# Patient Record
Sex: Female | Born: 1985 | Race: White | Hispanic: No | Marital: Married | Smoking: Never smoker
Health system: Southern US, Community
[De-identification: ages and names within clinical notes are randomized; demographics above are authoritative.]

## PROBLEM LIST (undated history)

## (undated) ENCOUNTER — Emergency Department (HOSPITAL_COMMUNITY): Discharge status: Absent without leave | Payer: 59

## (undated) DIAGNOSIS — N809 Endometriosis, unspecified: Secondary | ICD-10-CM

## (undated) DIAGNOSIS — D649 Anemia, unspecified: Secondary | ICD-10-CM

## (undated) DIAGNOSIS — E079 Disorder of thyroid, unspecified: Secondary | ICD-10-CM

## (undated) DIAGNOSIS — R519 Headache, unspecified: Secondary | ICD-10-CM

## (undated) DIAGNOSIS — E039 Hypothyroidism, unspecified: Secondary | ICD-10-CM

## (undated) DIAGNOSIS — N63 Unspecified lump in unspecified breast: Secondary | ICD-10-CM

## (undated) DIAGNOSIS — Z8489 Family history of other specified conditions: Secondary | ICD-10-CM

## (undated) DIAGNOSIS — E559 Vitamin D deficiency, unspecified: Secondary | ICD-10-CM

## (undated) DIAGNOSIS — R51 Headache: Secondary | ICD-10-CM

## (undated) HISTORY — DX: Endometriosis, unspecified: N80.9

## (undated) HISTORY — DX: Unspecified lump in unspecified breast: N63.0

## (undated) HISTORY — PX: TUBAL LIGATION: SHX77

## (undated) HISTORY — DX: Anemia, unspecified: D64.9

## (undated) HISTORY — DX: Vitamin D deficiency, unspecified: E55.9

## (undated) HISTORY — DX: Disorder of thyroid, unspecified: E07.9

---

## 2007-12-21 ENCOUNTER — Emergency Department: Payer: Self-pay | Admitting: Emergency Medicine

## 2008-04-10 ENCOUNTER — Observation Stay: Payer: Self-pay | Admitting: Obstetrics and Gynecology

## 2008-10-30 HISTORY — PX: FETAL BLOOD TRANSFUSION: SHX1602

## 2010-04-25 ENCOUNTER — Other Ambulatory Visit: Admission: RE | Admit: 2010-04-25 | Discharge: 2010-04-25 | Payer: Self-pay | Admitting: Family Medicine

## 2010-04-25 ENCOUNTER — Ambulatory Visit: Payer: Self-pay | Admitting: Family Medicine

## 2010-04-25 DIAGNOSIS — R030 Elevated blood-pressure reading, without diagnosis of hypertension: Secondary | ICD-10-CM | POA: Insufficient documentation

## 2010-04-25 DIAGNOSIS — Z862 Personal history of diseases of the blood and blood-forming organs and certain disorders involving the immune mechanism: Secondary | ICD-10-CM | POA: Insufficient documentation

## 2010-04-25 DIAGNOSIS — R5383 Other fatigue: Secondary | ICD-10-CM

## 2010-04-25 DIAGNOSIS — E038 Other specified hypothyroidism: Secondary | ICD-10-CM | POA: Insufficient documentation

## 2010-04-25 DIAGNOSIS — D509 Iron deficiency anemia, unspecified: Secondary | ICD-10-CM | POA: Insufficient documentation

## 2010-04-25 DIAGNOSIS — R5381 Other malaise: Secondary | ICD-10-CM | POA: Insufficient documentation

## 2010-04-25 LAB — CONVERTED CEMR LAB

## 2010-04-27 ENCOUNTER — Encounter: Payer: Self-pay | Admitting: Family Medicine

## 2010-04-27 LAB — CONVERTED CEMR LAB
Basophils Absolute: 0 10*3/uL (ref 0.0–0.1)
CO2: 28 meq/L (ref 19–32)
Chloride: 105 meq/L (ref 96–112)
Direct LDL: 62.1 mg/dL
Eosinophils Relative: 0.4 % (ref 0.0–5.0)
Glucose, Bld: 99 mg/dL (ref 70–99)
HCT: 36.6 % (ref 36.0–46.0)
HDL: 45.9 mg/dL (ref >39.00)
Hemoglobin: 13 g/dL (ref 12.0–15.0)
Lymphocytes Relative: 37.4 % (ref 12.0–46.0)
Lymphs Abs: 2.5 10*3/uL (ref 0.7–4.0)
Monocytes Relative: 6.1 % (ref 3.0–12.0)
Platelets: 248 10*3/uL (ref 150.0–400.0)
RDW: 12.5 % (ref 11.5–14.6)
Sodium: 138 meq/L (ref 135–145)
TSH: 4.03 microintl units/mL (ref 0.35–5.50)
Total CHOL/HDL Ratio: 3
Triglycerides: 291 mg/dL — ABNORMAL HIGH (ref 0.0–149.0)
VLDL: 58.2 mg/dL — ABNORMAL HIGH (ref 0.0–40.0)
WBC: 6.6 10*3/uL (ref 4.5–10.5)

## 2010-07-28 ENCOUNTER — Ambulatory Visit: Payer: Self-pay | Admitting: Family Medicine

## 2010-07-28 DIAGNOSIS — E785 Hyperlipidemia, unspecified: Secondary | ICD-10-CM | POA: Insufficient documentation

## 2010-07-29 LAB — CONVERTED CEMR LAB
Direct LDL: 68.4 mg/dL
Total CHOL/HDL Ratio: 4

## 2010-08-01 ENCOUNTER — Telehealth: Payer: Self-pay | Admitting: Family Medicine

## 2010-09-08 ENCOUNTER — Ambulatory Visit: Payer: Self-pay | Admitting: Family Medicine

## 2010-09-08 DIAGNOSIS — E781 Pure hyperglyceridemia: Secondary | ICD-10-CM | POA: Insufficient documentation

## 2010-09-08 LAB — CONVERTED CEMR LAB
ALT: 20 units/L (ref 0–35)
AST: 23 units/L (ref 0–37)
Albumin: 3.6 g/dL (ref 3.5–5.2)
Cholesterol: 133 mg/dL (ref 0–200)
HDL: 48.7 mg/dL (ref >39.00)
Total Bilirubin: 0.6 mg/dL (ref 0.3–1.2)
Total Protein: 6.4 g/dL (ref 6.0–8.3)
Triglycerides: 76 mg/dL (ref 0.0–149.0)
VLDL: 15.2 mg/dL (ref 0.0–40.0)

## 2010-10-04 ENCOUNTER — Telehealth: Payer: Self-pay | Admitting: Family Medicine

## 2010-10-06 ENCOUNTER — Emergency Department: Payer: Self-pay | Admitting: Internal Medicine

## 2010-10-06 ENCOUNTER — Encounter: Payer: Self-pay | Admitting: Family Medicine

## 2010-10-10 ENCOUNTER — Ambulatory Visit: Payer: Self-pay | Admitting: Family Medicine

## 2010-10-10 DIAGNOSIS — K8020 Calculus of gallbladder without cholecystitis without obstruction: Secondary | ICD-10-CM | POA: Insufficient documentation

## 2010-10-11 ENCOUNTER — Telehealth: Payer: Self-pay | Admitting: Family Medicine

## 2010-10-14 ENCOUNTER — Encounter: Payer: Self-pay | Admitting: Family Medicine

## 2010-10-30 HISTORY — PX: CHOLECYSTECTOMY: SHX55

## 2010-11-02 ENCOUNTER — Ambulatory Visit: Payer: Self-pay | Admitting: General Surgery

## 2010-11-02 ENCOUNTER — Encounter: Payer: Self-pay | Admitting: Family Medicine

## 2010-11-19 ENCOUNTER — Encounter: Payer: Self-pay | Admitting: Family Medicine

## 2010-11-29 NOTE — Letter (Signed)
Summary: Results Follow-up Letter  [facility]  [address] [place], [place] [address]   Phone: [phone]  Fax: [phone]    04/27/2010  [address] [facility] [address] [place], [place]  [address]  Dear [name],   The following are the results of your recent test(s):  Test     Result     Pap Smear    Normal___X____  Not Normal_____       Comments: _________________________________________________________ Cholesterol LDL(Bad cholesterol):          Your goal is less than:         HDL (Good cholesterol):        Your goal is more than: _________________________________________________________ Other Tests:     Sincerely,        Ruthe Mannan, MD

## 2010-11-29 NOTE — Assessment & Plan Note (Signed)
Summary: 6-8 WEEK F/U SINCE STARTING TRICOR/NT   Vital Signs:  Patient profile:   25 year old female Height:      61.25 inches Weight:      208 pounds BMI:     39.12 Temp:     98.3 degrees F oral Pulse rate:   88 / minute Pulse rhythm:   regular BP sitting:   120 / 82  (left arm) Cuff size:   large  Vitals Entered By: Linde Gillis CMA [facility]) (September 08, 2010 7:58 AM) CC: 6-8 week follow up since starting Tricor   History of Present Illness: 25 yo here for follow up after starting Tricor.  Lipid panel on 9/29- TG 495, HDL 38, LDL 68. Mom has elevated TG as well. Started Tricor 145 mg on 10/5.  Not noticing any side effects. Trying to cut sugar out of diet.  Fasting glucose was 99 in September.    Current Medications (verified): 1)  Fish Oil 1000 Mg Caps (Omega-3 Fatty Acids) .... Take One By Mouth Every Night 2)  Prenatal Multi Vitamins .... Take One By Mouth Daily 3)  Levothroid 50 Mcg Tabs (Levothyroxine Sodium) .... Take One By Mouth Daily 4)  Gianvi 3-0.02 Mg Tabs (Drospirenone-Ethinyl Estradiol) .Marland Kitchen.. 1 Tab By Mouth Daily. 5)  Tricor 145 Mg Tabs (Fenofibrate) .Marland Kitchen.. 1 Tab By Mouth Daily.  Allergies (verified): No Known Drug Allergies  Review of Systems      See HPI General:  Denies malaise and weakness. MS:  Denies muscle aches.  Physical Exam  General:  alert, well-developed, and overweight-appearing.   Psych:  Cognition and judgment appear intact. Alert and cooperative with normal attention span and concentration. No apparent delusions, illusions, hallucinations   Impression & Recommendations:  Problem # 1:  HYPERTRIGLYCERIDEMIA (ICD-272.1) Assessment New Time spent with patient 25 minutes, more than 50% of this time was spent counseling patient on how to decrease TG.  Weight loss (even small amount) can decrease triglycerides.  Decrease added sugars, eliminate trans fats, increase fiber and limit alcohol.  Recheck Lipid panel, hepatic panel  today. Continue Tricor.  Her updated medication list for this problem includes:    Tricor 145 Mg Tabs (Fenofibrate) .Marland Kitchen... 1 tab by mouth daily.  Orders: Venipuncture (02725) TLB-Lipid Panel (80061-LIPID) TLB-Hepatic/Liver Function Pnl (80076-HEPATIC)  Complete Medication List: 1)  Fish Oil 1000 Mg Caps (Omega-3 fatty acids) .... Take one by mouth every night 2)  Prenatal Multi Vitamins  .... Take one by mouth daily 3)  Levothroid 50 Mcg Tabs (Levothyroxine sodium) .... Take one by mouth daily 4)  Gianvi 3-0.02 Mg Tabs (Drospirenone-ethinyl estradiol) .Marland Kitchen.. 1 tab by mouth daily. 5)  Tricor 145 Mg Tabs (Fenofibrate) .Marland Kitchen.. 1 tab by mouth daily.   Orders Added: 1)  Venipuncture [36415] 2)  TLB-Lipid Panel [80061-LIPID] 3)  TLB-Hepatic/Liver Function Pnl [80076-HEPATIC] 4)  Est. Patient Level IV [36644]    Current Allergies (reviewed today): No known allergies

## 2010-11-29 NOTE — Progress Notes (Signed)
Summary: irregular bleeding  Phone Note Call from Patient Call back at Home Phone [phone]   Caller: Patient Call For: Ruthe Mannan MD Summary of Call: Pt states she missed one birth control pill last week and took 2 at the same the next day.  The day after that she started bleeding and is still bleeding- since last wednesday.  This is like a normal period, which was not supposed to start until next week.  She has been on her current pill since june.  Please advise.  She is concerned because when she was a teenager she had irregular bleeding that wouldnt stop and she had to have a transfusion. Initial call taken by: Lowella Petties CMA, AAMA,  October 04, 2010 2:48 PM  Follow-up for Phone Call        if she was near the end of her pill pack, i would go ahead and start another pack. Ruthe Mannan MD  October 05, 2010 7:20 AM  Spoke with patient and she has about four pink pills left and then the white sugar pills.  Would you like her to still start a new pack?  Please advise.  Linde Gillis CMA [facility])  October 05, 2010 10:06 AM   Additional Follow-up for Phone Call Additional follow up Details #1::        yes, restart new pack. Ruthe Mannan MD  October 05, 2010 10:09 AM  Patient advised as instructed via telephone. Additional Follow-up by: Linde Gillis CMA [facility]),  October 05, 2010 10:20 AM

## 2010-11-29 NOTE — Progress Notes (Signed)
Summary: ? about elevated triglycerides  Phone Note Call from Patient Call back at Home Phone [phone]   Caller: Patient Call For: Ruthe Mannan MD Summary of Call: Patient called and wants to know if she can try lowering her Triglycerides with diet changes before starting Tricor.  She does not have any insurance right now and paying for the Rx would be challenging.  I advised her that I doubt Dr. Dayton Martes would agree to dietary changes due to the fact that her Triglyceride levels are extremely high.   Please advise. Initial call taken by: Linde Gillis CMA [facility]),  August 01, 2010 3:54 PM  Follow-up for Phone Call        Can you please ask her to take Tricor, we can provide samples. Ruthe Mannan MD  August 01, 2010 4:19 PM  Patient advised as instructed via telephone.  She wanted to know how long she has to stay on Tricor.  Advised her that if she changes her eating habits and take the medication she may not have to stay on Tricor long term.    Follow-up by: Linde Gillis CMA [facility]),  August 01, 2010 4:29 PM

## 2010-11-29 NOTE — Assessment & Plan Note (Signed)
Summary: NEW PT TO EST/CLE   Vital Signs:  Patient profile:   25 year old female Height:      61.25 inches Weight:      214 pounds BMI:     40.25 Temp:     98.3 degrees F oral Pulse rate:   84 / minute Pulse rhythm:   regular BP sitting:   140 / 78  (right arm) Cuff size:   large  Vitals Entered By: Lowella Petties CMA (April 25, 2010 1:51 PM) CC: New patient to establish care   History of Present Illness: 25 yo here to establish care.  Hypothyroidism - Currently on levothyroxine 50 micrograms daily.  Was increased from 25 micrograms daily last year.  Last several months, feels more fatigued and noticed changes in her hair and nails (very dry).  Difficulty losing weight.  Menorrhagia- h/o iron deficiency anemia (required blood transfusion in 2009). On Ocella which is not controlling her periods as well as Yaz, but Yaz was more expensive.  H/o headaches so she has tried other OCPs prior to [place].  On Ocella, have two periods per month.  Elevated BP- reports that at work, typically 130s/80s.  No HA, blurred vision, LE edema or SOB.  Well woman- due for FLP (especially since she is on OCPs), tetanus immunization and pap smear. Wants to be tested for HIV since she did have a blood transfusion.  Preventive Screening-Counseling & Management  Alcohol-Tobacco     Smoking Status: never  Caffeine-Diet-Exercise     Does Patient Exercise: no  Current Medications (verified): 1)  Ocella 3-0.03 Mg Tabs (Drospirenone-Ethinyl Estradiol) .... Take One By Mouth Daily As Directed 2)  Fish Oil 1000 Mg Caps (Omega-3 Fatty Acids) .... Take One By Mouth Every Night 3)  Prenatal Multi Vitamins .... Take One By Mouth Daily 4)  Levothroid 50 Mcg Tabs (Levothyroxine Sodium) .... Take One By Mouth Daily 5)  Gianvi 3-0.02 Mg Tabs (Drospirenone-Ethinyl Estradiol) .Marland Kitchen.. 1 Tab By Mouth Daily.  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 Never Smoked Alcohol use-yes Regular exercise-no  Risk Factors: Exercise: no (04/25/2010)  Risk Factors: Smoking Status: never (04/25/2010)  Past Medical History: Hypothyroidism Anemia-iron deficiency due to menorrhagia, required blood transfusion in 2009 (hgb was 6.0).  Past Surgical History: Denies surgical history  Family History: Dad- healthy Mom- HTN, DM  Social History: Works in Location manager at [employer]. Never Smoked Alcohol use-yes Regular exercise-no Smoking Status:  never Does Patient Exercise:  no  Review of Systems      See HPI General:  Complains of fatigue and malaise. Eyes:  Denies blurring. ENT:  Denies difficulty swallowing. CV:  Denies chest pain or discomfort. Resp:  Denies shortness of breath. GI:  Denies abdominal pain and change in bowel habits. GU:  Denies abnormal vaginal bleeding, discharge, and dysuria. MS:  Denies joint pain, joint redness, and joint swelling. Derm:  Denies rash. Neuro:  Denies visual disturbances and weakness. Psych:  Denies anxiety and depression. Endo:  Denies cold intolerance and heat intolerance. Heme:  Denies abnormal bruising and bleeding.  Physical Exam  General:  alert, well-developed, and overweight-appearing.   Head:  normocephalic and atraumatic.   Eyes:  vision grossly intact, pupils equal, pupils round, and pupils reactive to light.   Ears:  R ear normal and L ear normal.  Nose:  no external deformity.   Mouth:  good dentition.   Breasts:  No mass, nodules, thickening, tenderness, bulging, retraction, inflamation, nipple discharge or skin changes noted.   Lungs:  Normal respiratory effort, chest expands symmetrically. Lungs are clear to auscultation, no crackles or wheezes. Heart:  Normal rate and regular rhythm. S1 and S2 normal without gallop, murmur, click, rub or other extra  sounds. Abdomen:  Bowel sounds positive,abdomen soft and non-tender without masses, organomegaly or hernias noted. Rectal:  no external abnormalities and no hemorrhoids.   Genitalia:  Normal introitus for age, no external lesions, no vaginal discharge, mucosa pink and moist, no vaginal or cervical lesions, no vaginal atrophy, no friaility or hemorrhage, normal uterus size and position, no adnexal masses or tenderness Msk:  No deformity or scoliosis noted of thoracic or lumbar spine.   Extremities:  No clubbing, cyanosis, edema, or deformity noted with normal full range of motion of all joints.   Neurologic:  alert & oriented X3 and gait normal.   Skin:  Intact without suspicious lesions or rashes skin tags Psych:  Cognition and judgment appear intact. Alert and cooperative with normal attention span and concentration. No apparent delusions, illusions, hallucinations   Impression & Recommendations:  Problem # 1:  Preventive Health Care (ICD-V70.0) Reviewed preventive care protocols, scheduled due services, and updated immunizations Discussed nutrition, exercise, diet, and healthy lifestyle.  Pap, FLP, BMET and tetanus immunization today.  Problem # 2:  HYPOTHYROIDISM (ICD-244.9) Assessment: Unchanged Will recheck TSH today given worsening symptoms. Her updated medication list for this problem includes:    Levothroid 50 Mcg Tabs (Levothyroxine sodium) .Marland Kitchen... Take one by mouth daily  Problem # 3:  MENORRHAGIA (ICD-626.2) Assessment: Deteriorated Will try Gianvi, a little cheaper than Yaz but similar to see if she has improved symptoms with this rather than Ocella.  Pt in agreement with plan. Her updated medication list for this problem includes:    Ocella 3-0.03 Mg Tabs (Drospirenone-ethinyl estradiol) .Marland Kitchen... Take one by mouth daily as directed    Gianvi 3-0.02 Mg Tabs (Drospirenone-ethinyl estradiol) .Marland Kitchen... 1 tab by mouth daily.  Problem # 4:  ANEMIA-IRON DEFICIENCY  (ICD-280.9) Assessment: Unchanged With heavier bleeding and worsening fatigue, will recheck CBC today.  Problem # 5:  ELEVATED BP READING WITHOUT DX HYPERTENSION (ICD-796.2) Assessment: New Discussed limiting sodium in diet. Encouraged exercise as well. Continue to monitor, check BMET today. Orders: Venipuncture (16109) TLB-BMP (Basic Metabolic Panel-BMET) (80048-METABOL)  Complete Medication List: 1)  Ocella 3-0.03 Mg Tabs (Drospirenone-ethinyl estradiol) .... Take one by mouth daily as directed 2)  Fish Oil 1000 Mg Caps (Omega-3 fatty acids) .... Take one by mouth every night 3)  Prenatal Multi Vitamins  .... Take one by mouth daily 4)  Levothroid 50 Mcg Tabs (Levothyroxine sodium) .... Take one by mouth daily 5)  Gianvi 3-0.02 Mg Tabs (Drospirenone-ethinyl estradiol) .Marland Kitchen.. 1 tab by mouth daily.  Other Orders: TLB-TSH (Thyroid Stimulating Hormone) (84443-TSH) TLB-CBC Platelet - w/Differential (85025-CBCD) TLB-Lipid Panel (80061-LIPID) T-HIV Antibody  (Reflex) ([id number]) Prescriptions: GIANVI 3-0.02 MG TABS (DROSPIRENONE-ETHINYL ESTRADIOL) 1 tab by mouth daily.  #1 x 3   Entered and Authorized by:   Ruthe Mannan MD   Signed by:   Ruthe Mannan MD on 04/25/2010   Method used:   Print then Give to Patient   RxID:   [id number]   Prior Medications (reviewed today): OCELLA 3-0.03 MG TABS (DROSPIRENONE-ETHINYL ESTRADIOL) take one by mouth daily as directed FISH OIL 1000 MG CAPS (OMEGA-3 FATTY ACIDS)  take one by mouth every night PRENATAL MULTI VITAMINS () take one by mouth daily LEVOTHROID 50 MCG TABS (LEVOTHYROXINE SODIUM) take one by mouth daily Current Allergies (reviewed today): No known allergies   Prevention & Chronic Care Immunizations   Influenza vaccine: Not documented    Tetanus booster: Not documented    Pneumococcal vaccine: Not documented  Other Screening   Pap smear: Not documented   Pap smear action/deferral: Ordered  (04/25/2010)   Smoking status:  never  (04/25/2010)   Nursing Instructions: Give tetanus booster today Pap smear today   Appended Document: NEW PT TO EST/CLE    Clinical Lists Changes  Orders: Added new Service order of Tdap => 70yrs IM (16109) - Signed Added new Service order of Admin 1st Vaccine (60454) - Signed Added new Service order of Admin 1st Vaccine [facility]) [phone]) - Signed Observations: Added new observation of TD BOOST VIS: 09/17/07 version given April 25, 2010. (04/25/2010 15:10) Added new observation of TD BOOSTERLO: [id number] (04/25/2010 15:10) Added new observation of TD BOOST EXP: 01/21/2012 (04/25/2010 15:10) Added new observation of TD BOOSTERBY: Lowella Petties CMA (04/25/2010 15:10) Added new observation of TD BOOSTERRT: IM (04/25/2010 15:10) Added new observation of TDBOOSTERDSE: 0.5 ml (04/25/2010 15:10) Added new observation of TD BOOSTERMF: GlaxoSmithKline (04/25/2010 15:10) Added new observation of TD BOOST SIT: left deltoid (04/25/2010 15:10) Added new observation of TD BOOSTER: Tdap (04/25/2010 15:10)       Tetanus/Td Vaccine    Vaccine Type: Tdap    Site: left deltoid    Mfr: GlaxoSmithKline    Dose: 0.5 ml    Route: IM    Given by: Lowella Petties CMA    Exp. Date: 01/21/2012    Lot #: [id number]    VIS given: 09/17/07 version given April 25, 2010.

## 2010-12-01 NOTE — Op Note (Signed)
Summary: Laparoscopic cholecystectomy/Dr. Lemar Livings  Laparoscopic cholecystectomy/Dr. Lemar Livings   Imported By: Maryln Gottron 11/10/2010 12:22:45  _____________________________________________________________________  External Attachment:    Type:   Image     Comment:   External Document

## 2010-12-01 NOTE — Assessment & Plan Note (Signed)
Summary: F/U [facility] ER ON 10/06/10/CLE   Vital Signs:  Patient profile:   25 year old female Height:      61.25 inches Weight:      210 pounds BMI:     39.50 Temp:     98.7 degrees F oral Pulse rate:   82 / minute Pulse rhythm:   regular BP sitting:   112 / 66  (left arm)  Vitals Entered By: Melody Comas (October 10, 2010 3:17 PM) CC: ER follow up    History of Present Illness: 25 yo here for ER followup.  ER notes reviewed.  Went to [facility] on 12/8 for acute onset of severe left  lower quadrant, puslating back pain that radiated to mid abdomen. Was associated with nausea, worsened by eating. No urinary symptoms.  CT scan showed right ovarian cyst and large caclified gallstone without obstruction. Symptoms are slowly improving.  No current abdominal pain or nausea.  UA was positive for blood but was mentruating.  Otherwise normal.  No known injury to her back.  Pain started after eating breakfast.  Allergies (verified): No Known Drug Allergies  Review of Systems      See HPI General:  Denies fever. GI:  Complains of abdominal pain and nausea; denies change in bowel habits and vomiting. GU:  Denies discharge, dysuria, hematuria, urinary frequency, and urinary hesitancy.  Physical Exam  General:  alert, well-developed, and overweight-appearing.   Abdomen:  Bowel sounds positive,abdomen soft and non-tender without masses, organomegaly or hernias noted. Msk:  No deformity or scoliosis noted of thoracic or lumbar spine.   Extremities:  No clubbing, cyanosis, edema, or deformity noted with normal full range of motion of all joints.   Neurologic:  alert & oriented X3 and gait normal.   Skin:  Intact without suspicious lesions or rashes skin tags Psych:  Cognition and judgment appear intact. Alert and cooperative with normal attention span and concentration. No apparent delusions, illusions, hallucinations   Impression & Recommendations:  Problem # 1:  CHOLELITHIASIS  (ICD-574.20) Assessment New Although pain atypical (on left side, not right), does sound consistent with biliary colic. No kidney stones seen on CT. I recommended referral to surgery to discuss possible removal of her gallbladder. She will discuss with her fiance and get back to me.    Complete Medication List: 1)  Fish Oil 1000 Mg Caps (Omega-3 fatty acids) .... Take one by mouth every night 2)  Prenatal Multi Vitamins  .... Take one by mouth daily 3)  Levothroid 50 Mcg Tabs (Levothyroxine sodium) .... Take one by mouth daily 4)  Gianvi 3-0.02 Mg Tabs (Drospirenone-ethinyl estradiol) .Marland Kitchen.. 1 tab by mouth daily. 5)  Tricor 145 Mg Tabs (Fenofibrate) .Marland Kitchen.. 1 tab by mouth daily.  Patient Instructions: 1)  Good to see you Brittannie. 2)  I would recommend a consult with a surgeon to discuss removing your gall blader since you have a large gallstone that may be contributing to your pain (1.8 cm). 3)  Call me if you decide you want a referral.   Orders Added: 1)  Est. Patient Level IV [84132]    Current Allergies (reviewed today): No known allergies

## 2010-12-01 NOTE — Progress Notes (Signed)
Summary: wants referral to surgeon  Phone Note Call from Patient Call back at Home Phone [phone]   Caller: Patient Call For: Ruthe Mannan MD Summary of Call: Pt wants to proceed with surgical referral for her gallbladder.  She prefers to go to [place]. Initial call taken by: Lowella Petties CMA, AAMA,  October 11, 2010 8:14 AM

## 2010-12-01 NOTE — Consult Note (Signed)
[facility]: [facility]   Imported By: Lanelle Bal 10/26/2010 14:39:13  _____________________________________________________________________  External Attachment:    Type:   Image     Comment:   External Document

## 2010-12-07 NOTE — Letter (Signed)
Summary: [facility]  [facility]   Imported By: Kassie Mends 12/02/2010 09:40:00  _____________________________________________________________________  External Attachment:    Type:   Image     Comment:   External Document  Appended Document: [facility] doing well s/p lap chole

## 2010-12-29 ENCOUNTER — Telehealth: Payer: Self-pay | Admitting: Family Medicine

## 2010-12-30 ENCOUNTER — Other Ambulatory Visit: Payer: Self-pay | Admitting: Family Medicine

## 2010-12-30 ENCOUNTER — Other Ambulatory Visit (INDEPENDENT_AMBULATORY_CARE_PROVIDER_SITE_OTHER): Payer: Self-pay

## 2010-12-30 ENCOUNTER — Encounter (INDEPENDENT_AMBULATORY_CARE_PROVIDER_SITE_OTHER): Payer: Self-pay | Admitting: *Deleted

## 2010-12-30 DIAGNOSIS — E785 Hyperlipidemia, unspecified: Secondary | ICD-10-CM

## 2010-12-30 DIAGNOSIS — E781 Pure hyperglyceridemia: Secondary | ICD-10-CM

## 2010-12-30 LAB — LIPID PANEL
Cholesterol: 134 mg/dL (ref 0–200)
HDL: 51.5 mg/dL (ref >39.00)
LDL Cholesterol: 63 mg/dL (ref 0–99)
Total CHOL/HDL Ratio: 3
Triglycerides: 100 mg/dL (ref 0.0–149.0)
VLDL: 20 mg/dL (ref 0.0–40.0)

## 2010-12-30 LAB — HEPATIC FUNCTION PANEL
ALT: 28 U/L (ref 0–35)
AST: 24 U/L (ref 0–37)
Albumin: 3.8 g/dL (ref 3.5–5.2)
Alkaline Phosphatase: 49 U/L (ref 39–117)
Bilirubin, Direct: 0.1 mg/dL (ref 0.0–0.3)
Total Bilirubin: 0.3 mg/dL (ref 0.3–1.2)
Total Protein: 6.7 g/dL (ref 6.0–8.3)

## 2011-01-05 NOTE — Progress Notes (Signed)
Summary: does pt need labs?  Phone Note Call from Patient Call back at Home Phone [phone]   Caller: Patient Call For: Ruthe Mannan MD Summary of Call: Pt is out of tricor and does not have insurance.  She is asking if she needs to continue with this, does she need labs to find her level? Initial call taken by: Lowella Petties CMA, AAMA,  December 29, 2010 8:10 AM  Follow-up for Phone Call        let's give her samples of trilipix( I think we have some), very similar to tricor.  Please update her med list when she comes in for that and also please check FLP and hepatic panel when she comes in for samples. Ruthe Mannan MD  December 29, 2010 10:35 AM  Spoke with patient, she will come in tomorrow morning for labs and to pick up samples. Follow-up by: Linde Gillis CMA [facility]),  December 29, 2010 11:32 AM

## 2011-03-06 ENCOUNTER — Telehealth: Payer: Self-pay | Admitting: *Deleted

## 2011-03-06 NOTE — Telephone Encounter (Signed)
Ok please see if we have Trilipix and if so, please give her samples.

## 2011-03-06 NOTE — Telephone Encounter (Signed)
Patient called to request samples of the medication that you gave her for her triglycerides. Patient states that he insurance will not start until the first of June.

## 2011-03-06 NOTE — Telephone Encounter (Signed)
Patient advised as instructed via telephone.  Samples of Trilipix left at front desk, one month supply.  Patient was on Tricor 145mg  and was changed to Trilipix 135.

## 2011-03-06 NOTE — Telephone Encounter (Signed)
Patient called to request more samples of the medication that you gave her for her triglyerides. Patient states that her insurance will not start until June 1st.

## 2011-03-31 ENCOUNTER — Ambulatory Visit: Payer: Self-pay | Admitting: Family Medicine

## 2011-04-03 ENCOUNTER — Encounter: Payer: Self-pay | Admitting: Family Medicine

## 2011-04-05 ENCOUNTER — Encounter: Payer: Self-pay | Admitting: Family Medicine

## 2011-04-05 ENCOUNTER — Ambulatory Visit (INDEPENDENT_AMBULATORY_CARE_PROVIDER_SITE_OTHER): Payer: BC Managed Care – PPO | Admitting: Family Medicine

## 2011-04-05 VITALS — BP 100/62 | HR 86 | Temp 98.6°F | Ht 61.0 in | Wt 214.0 lb

## 2011-04-05 DIAGNOSIS — Z862 Personal history of diseases of the blood and blood-forming organs and certain disorders involving the immune mechanism: Secondary | ICD-10-CM

## 2011-04-05 DIAGNOSIS — R5381 Other malaise: Secondary | ICD-10-CM

## 2011-04-05 DIAGNOSIS — E039 Hypothyroidism, unspecified: Secondary | ICD-10-CM

## 2011-04-05 LAB — BASIC METABOLIC PANEL
BUN: 14 mg/dL (ref 6–23)
CO2: 24 mEq/L (ref 19–32)
Calcium: 8.8 mg/dL (ref 8.4–10.5)
Chloride: 107 mEq/L (ref 96–112)
Creatinine, Ser: 0.9 mg/dL (ref 0.4–1.2)
Glucose, Bld: 147 mg/dL — ABNORMAL HIGH (ref 70–99)

## 2011-04-05 LAB — CBC WITH DIFFERENTIAL/PLATELET
Basophils Absolute: 0 10*3/uL (ref 0.0–0.1)
Basophils Relative: 0.3 % (ref 0.0–3.0)
Eosinophils Absolute: 0.1 10*3/uL (ref 0.0–0.7)
MCHC: 34.6 g/dL (ref 30.0–36.0)
MCV: 92.7 fl (ref 78.0–100.0)
Monocytes Absolute: 0.3 10*3/uL (ref 0.1–1.0)
Neutrophils Relative %: 68.3 % (ref 43.0–77.0)
RBC: 3.87 Mil/uL (ref 3.87–5.11)
RDW: 12.5 % (ref 11.5–14.6)

## 2011-04-05 LAB — VITAMIN B12: Vitamin B-12: 495 pg/mL (ref 211–911)

## 2011-04-05 LAB — TSH: TSH: 3.82 u[IU]/mL (ref 0.35–5.50)

## 2011-04-05 MED ORDER — CHOLINE FENOFIBRATE 135 MG PO CPDR: 135.0000 mg | DELAYED_RELEASE_CAPSULE | Freq: Every day | ORAL | Status: DC

## 2011-04-05 NOTE — Progress Notes (Signed)
25 yo here for worsening fatigue x past two months.    Hypothyroidism - Currently on levothyroxine 50 micrograms daily.  Was increased from 25 micrograms daily last year.  Also waking up in the middle of not with hot flashes. Husband states that she is not snoring. Very tired during the day. Denies any palpitations, changes in her hair bowels. No CP or SOB.    Menorrhagia- h/o iron deficiency anemia (required blood transfusion in 2009). On Helen Hashimoto which is controlling her periods.  The PMH, PSH, Social History, Family History, Medications, and allergies have been reviewed in [facility], and have been updated if relevant.   Review of Systems       See HPI General:  Complains of fatigue and malaise. Eyes:  Denies blurring. ENT:  Denies difficulty swallowing. CV:  Denies chest pain or discomfort. Resp:  Denies shortness of breath. GI:  Denies abdominal pain and change in bowel habits. GU:  Denies abnormal vaginal bleeding, discharge, and dysuria. MS:  Denies joint pain, joint redness, and joint swelling. Derm:  Denies rash. Neuro:  Denies visual disturbances and weakness. Psych:  Denies anxiety and depression. Endo:  Denies cold intolerance and heat intolerance. Heme:  Denies abnormal bruising and bleeding.  Physical Exam BP 100/62  Pulse 86  Temp(Src) 98.6 F (37 C) (Oral)  Ht 5\' 1"  (1.549 m)  Wt 214 lb (97.07 kg)  BMI 40.44 kg/m2  General:  alert, well-developed, and overweight-appearing.   Head:  normocephalic and atraumatic.   Thyroid enlarged, nontender, no palpable masses. Eyes:  vision grossly intact, pupils equal, pupils round, and pupils reactive to light.   Ears:  R ear normal and L ear normal.   Nose:  no external deformity.   Mouth:  good dentition.   Lungs:  Normal respiratory effort, chest expands symmetrically. Lungs are clear to auscultation, no crackles or wheezes. Heart:  Normal rate and regular rhythm. S1 and S2 normal without gallop, murmur, click, rub or  other extra sounds. Abdomen:  Bowel sounds positive,abdomen soft and non-tender without masses, organomegaly or hernias noted. Skin:  Intact without suspicious lesions or rashes skin tags Psych:  Cognition and judgment appear intact. Alert and cooperative with normal attention span and concentration. No apparent delusions, illusions, hallucinations  A/P- fatigue- Deteriorated. Likely multifactorial, given her history, will check Thyroid panel, CBC along with BMET and B12. If all labs within normal limits, consider sleep study.

## 2011-04-06 ENCOUNTER — Telehealth: Payer: Self-pay | Admitting: *Deleted

## 2011-04-06 NOTE — Telephone Encounter (Signed)
Yes I did mean pregnant.  Spoke with patient and advised as instructed.  She is coming in tomorrow for a urine hcg for a nurse visit.

## 2011-04-06 NOTE — Telephone Encounter (Signed)
Patient called and wanted to know if we could possibly add on a test to check to see if she is present.  She stated that she doesn't know any other reasons why she would be fatigue.  Please advise.

## 2011-04-06 NOTE — Telephone Encounter (Signed)
Did you mean pregnant? I would recommend her coming in for urine preg first.

## 2011-04-07 ENCOUNTER — Other Ambulatory Visit (INDEPENDENT_AMBULATORY_CARE_PROVIDER_SITE_OTHER): Payer: BC Managed Care – PPO | Admitting: Family Medicine

## 2011-04-07 DIAGNOSIS — R5383 Other fatigue: Secondary | ICD-10-CM

## 2011-04-07 DIAGNOSIS — R5381 Other malaise: Secondary | ICD-10-CM

## 2011-04-07 NOTE — Progress Notes (Signed)
Patient stated that her and her husband just got a new mattress and she will see if this helps with her sleep and fatigue.  She will let us know.

## 2011-04-10 ENCOUNTER — Telehealth: Payer: Self-pay | Admitting: *Deleted

## 2011-04-10 NOTE — Telephone Encounter (Signed)
Good. Please ask her to find out from her insurance company what they will cover, they normally give [place] an alternative.

## 2011-04-10 NOTE — Telephone Encounter (Signed)
Patient called to let you know that she does seem to be feeling a little better with the new mattress. She is also asking if you could change the trilipix to something different because her insurance will not cover it. Uses [facility] [address].

## 2011-04-11 ENCOUNTER — Telehealth: Payer: Self-pay | Admitting: *Deleted

## 2011-04-11 MED ORDER — GEMFIBROZIL 600 MG PO TABS: 600.0000 mg | ORAL_TABLET | Freq: Two times a day (BID) | ORAL | Status: DC

## 2011-04-11 NOTE — Telephone Encounter (Signed)
Rx sent 

## 2011-04-11 NOTE — Telephone Encounter (Signed)
Patient called back and stated that she checked with her insurance company and they will pay for Fenofibrate and Gemfibrozil.  She would like Rx sent to [facility].

## 2011-04-11 NOTE — Telephone Encounter (Signed)
Patient advised as instructed via telephone. 

## 2011-04-11 NOTE — Telephone Encounter (Signed)
Left a message on cell phone voicemail advising patient as instructed.  Advised her to call her insurance company to find out what they will cover and call to let us know.

## 2011-05-16 ENCOUNTER — Other Ambulatory Visit: Payer: Self-pay | Admitting: *Deleted

## 2011-05-16 MED ORDER — GEMFIBROZIL 600 MG PO TABS: 600.0000 mg | ORAL_TABLET | Freq: Two times a day (BID) | ORAL | Status: DC

## 2011-05-16 MED ORDER — LEVOTHYROXINE SODIUM 50 MCG PO TABS: 50.0000 ug | ORAL_TABLET | Freq: Every day | ORAL | Status: DC

## 2011-05-16 MED ORDER — DROSPIRENONE-ETHINYL ESTRADIOL 3-0.02 MG PO TABS: 1.0000 | ORAL_TABLET | Freq: Every day | ORAL | Status: DC

## 2011-05-18 ENCOUNTER — Other Ambulatory Visit: Payer: Self-pay | Admitting: *Deleted

## 2011-05-18 MED ORDER — DROSPIRENONE-ETHINYL ESTRADIOL 3-0.02 MG PO TABS: 1.0000 | ORAL_TABLET | Freq: Every day | ORAL | Status: DC

## 2011-05-18 MED ORDER — LEVOTHYROXINE SODIUM 50 MCG PO TABS: 50.0000 ug | ORAL_TABLET | Freq: Every day | ORAL | Status: DC

## 2011-05-18 MED ORDER — GEMFIBROZIL 600 MG PO TABS: 600.0000 mg | ORAL_TABLET | Freq: Two times a day (BID) | ORAL | Status: DC

## 2011-10-31 HISTORY — PX: FOOT SURGERY: SHX648

## 2011-12-04 ENCOUNTER — Ambulatory Visit (INDEPENDENT_AMBULATORY_CARE_PROVIDER_SITE_OTHER): Payer: BC Managed Care – PPO | Admitting: Family Medicine

## 2011-12-04 ENCOUNTER — Encounter: Payer: Self-pay | Admitting: Family Medicine

## 2011-12-04 VITALS — BP 122/80 | HR 94 | Temp 98.7°F | Wt 218.5 lb

## 2011-12-04 DIAGNOSIS — J069 Acute upper respiratory infection, unspecified: Secondary | ICD-10-CM

## 2011-12-04 DIAGNOSIS — L239 Allergic contact dermatitis, unspecified cause: Secondary | ICD-10-CM | POA: Insufficient documentation

## 2011-12-04 DIAGNOSIS — L259 Unspecified contact dermatitis, unspecified cause: Secondary | ICD-10-CM

## 2011-12-04 MED ORDER — HYDROXYZINE HCL 10 MG PO TABS: 10.0000 mg | ORAL_TABLET | Freq: Three times a day (TID) | ORAL | Status: AC | PRN

## 2011-12-04 NOTE — Patient Instructions (Signed)
Good to see you. I think you are still coming into contact with whatever is causing this itching. Try to find triggers at work. Keep me posted.

## 2011-12-04 NOTE — Progress Notes (Signed)
Subjective:    Patient ID: Kimberly Garner, female    DOB: 1986-01-09, 26 y.o.   MRN: [id number]  HPI  26 yo here for : 1.  URI symptoms- cough, congestion, ears feel full x3 days. No CP, SOB, wheezing or fevers.  2.  Rash- started to have an itchy rash on her arms bilaterally two weeks ago after she started using a new body wash.  Stopped using it but rash continued to worsen- now has it on her abdomen and upper thighs bilaterally.  Benadryl only helps a little.  She is using rx steroid cream which does help. NO new meds. No one in house is complaining of similar rash but co worker does have some similar looking rash on her arms bilaterally.  Patient Active Problem List  Diagnoses  . Unspecified hypothyroidism  . HYPERTRIGLYCERIDEMIA  . HYPERLIPIDEMIA  . ANEMIA-IRON DEFICIENCY  . FATIGUE  . ELEVATED BP READING WITHOUT DX HYPERTENSION  . IRON DEFICIENCY ANEMIA, HX OF  . CHOLELITHIASIS  . Contact dermatitis, allergic  . URI (upper respiratory infection)   Past Medical History  Diagnosis Date  . Thyroid disease   . Anemia     iron deficiency   No past surgical history on file. History  Substance Use Topics  . Smoking status: Never Smoker   . Smokeless tobacco: Not on file  . Alcohol Use: Yes   Family History  Problem Relation Age of Onset  . Hypertension Mother   . Diabetes Mother    No Known Allergies Current Outpatient Prescriptions on File Prior to Visit  Medication Sig Dispense Refill  . drospirenone-ethinyl estradiol (YAZ) 3-0.02 MG per tablet Take 1 tablet by mouth daily.  90 tablet  2  . gemfibrozil (LOPID) 600 MG tablet Take 1 tablet (600 mg total) by mouth 2 (two) times daily.  180 tablet  2  . levothyroxine (SYNTHROID, LEVOTHROID) 50 MCG tablet Take 1 tablet (50 mcg total) by mouth daily.  90 tablet  2  . Omega-3 Fatty Acids (FISH OIL) 1000 MG CAPS Take 1 capsule by mouth at bedtime.        . Prenatal Vit-DSS-Fe Cbn-FA (PRENATAL MULTIVITAMIN-ULTRA PO) Take 1  tablet by mouth daily.         The PMH, PSH, Social History, Family History, Medications, and allergies have been reviewed in [facility], and have been updated if relevant.    Review of Systems See HPI  No fever     Objective:   Physical Exam BP 122/80  Pulse 94  Temp(Src) 98.7 F (37.1 C) (Oral)  Wt 218 lb 8 oz (99.111 kg)  General:  Well-developed,well-nourished,in no acute distress; alert,appropriate and cooperative throughout examination Head:  normocephalic and atraumatic.   Eyes:  vision grossly intact, pupils equal, pupils round, and pupils reactive to light.   Ears:  R ear normal and L ear normal.  Mouth:  Mild pharyngeal erythema  Lungs:  Normal respiratory effort, chest expands symmetrically. Lungs are clear to auscultation, no crackles or wheezes. Heart:  Normal rate and regular rhythm. S1 and S2 normal without gallop, murmur, click, rub or other extra sounds. Extremities:  No clubbing, cyanosis, edema, or deformity noted with normal full range of motion of all joints.   Neurologic:  alert & oriented X3 and gait normal.   Skin:  Intact without suspicious lesions or rashes, raised, erythematous macular papular rash on bilateral upper thighs, unable to see rash evident elsewhere Psych:  Cognition and judgment appear intact. Alert and cooperative  with normal attention span and concentration. No apparent delusions, illusions, hallucinations        Assessment & Plan:   1. Contact dermatitis, allergic New.   Does seem allergic, does not appear consistent with scabies. ?New soaps or detergents at work. 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 Rx for atarax sent in, discussed sedation precautions.   2. URI (upper respiratory infection)  New, likely viral. Advised supportive care.

## 2011-12-12 ENCOUNTER — Telehealth: Payer: Self-pay | Admitting: Family Medicine

## 2011-12-12 NOTE — Telephone Encounter (Signed)
Triage Record Num: [id number] Operator: Neita Goodnight Patient Name: Kimberly Garner Call Date & Time: 12/11/2011 1:45:42PM Patient Phone: [phone] PCP: Ruthe Mannan Patient Gender: Female PCP Fax : [phone] Patient DOB: 08-14-1986 Practice Name: [facility] Reason for Call: Caller: Meisha/Patient; PCP: Ruthe Mannan [facility]); CB#: [phone]; LMP: 01/21. Call regarding Rash/Hives; noticed 02/11. Seen in office week of 02/04 for rash, received Hydroxyzine, seemed to get better. Started drinking Slim Fast on 02/11, developed hives to lower back, sides of stomach an hour after drinking it. (Remembered she was drinking Slim Fast before the first outbreak of hives, but didn't mention to the doctor. She had stopped drinking this after her appt, but then restarted on 02/11.) Emergent sx negative. Care advice per "Hives" with appt advised within 72h due to "repeat episode of hives within 6 months." Call back parameters reviewed. Will follow up with office if hives worsen or do not improve. Protocol(s) Used: Hives Recommended Outcome per Protocol: See Provider within 72 Hours Reason for Outcome: Repeat episode of hives within 6 months Care Advice: Cool/tepid showers or baths may help relieve itching. If cool water alone does not relieve itching, try adding 1/2 to 1 cup baking soda or colloidal oatmeal (Aveeno) to bath water. ~ ~ Call provider for severe itching that is not relieved with home care treatments. ~ Call provider immediately if symptoms worsen before seeing provider. For symptom relief, consider nonprescription antihistamines (such as Allerest, Claritin, Zyrtec, Chlor-Trimetron, Benadryl, etc.) as directed on label or by pharmacist. Drowsiness may result, especially in geriatric patients. Non-sedating antihistamines are available without a prescription. ~ 12/11/2011 1:56:05PM Page 1 of 1 [email or web address]

## 2011-12-12 NOTE — Telephone Encounter (Signed)
Noted. Agreed.

## 2011-12-22 IMAGING — CT CT STONE STUDY
1 of 2 series · 15 of 32 positions shown, 19 images · non-contrast
Comparison: none

REASON FOR EXAM: flank pain l
COMMENTS:

PROCEDURE:     CT  - CT ABDOMEN /PELVIS WO (STONE)  - October 06, 2010 [DATE]
RESULT:     Comparison: None
TECHNIQUE: Multiple axial images from the lung bases to the symphysis pubis
were obtained without oral and without intravenous contrast.

[Series 2: soft tissue · axial · 0.66mm/px · z∈[+56,+488]mm · 15 of 156 slices shown, 19 images]
[im 6/156  soft-tissue]
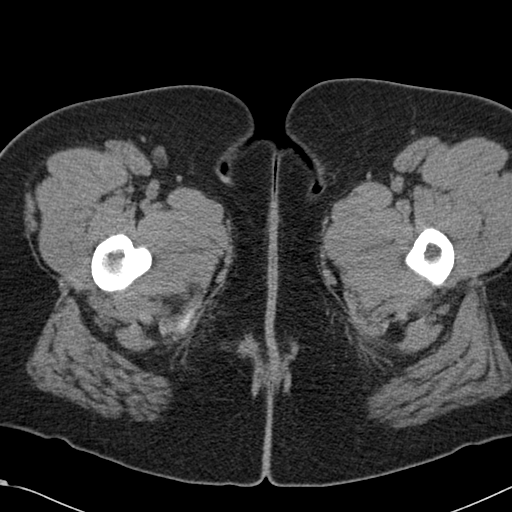
[im 6/156  bone]
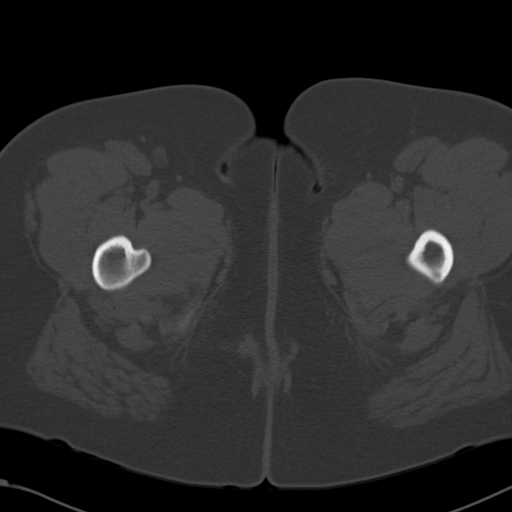
[im 18/156  soft-tissue]
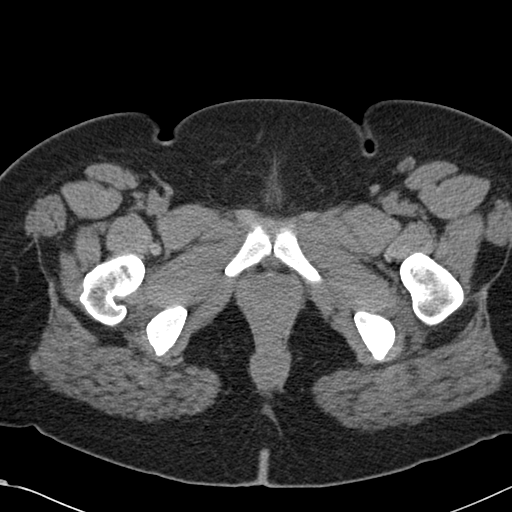
[im 30/156  soft-tissue]
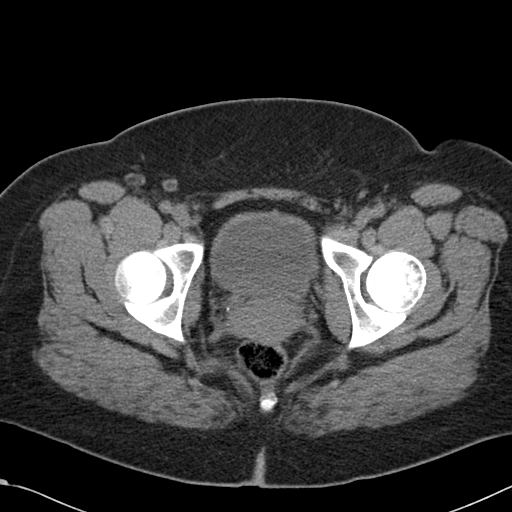
[im 42/156  soft-tissue]
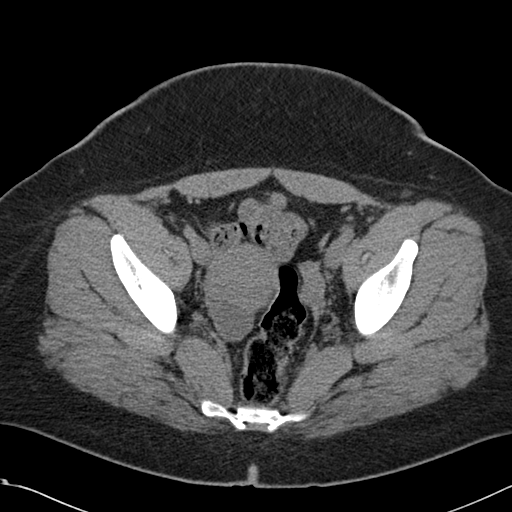
[im 54/156  soft-tissue]
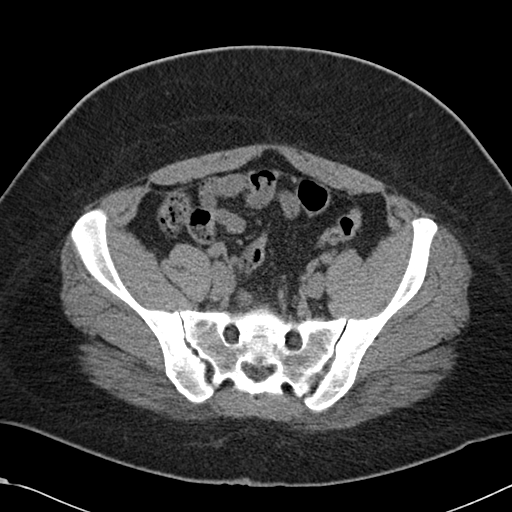
[im 66/156  soft-tissue]
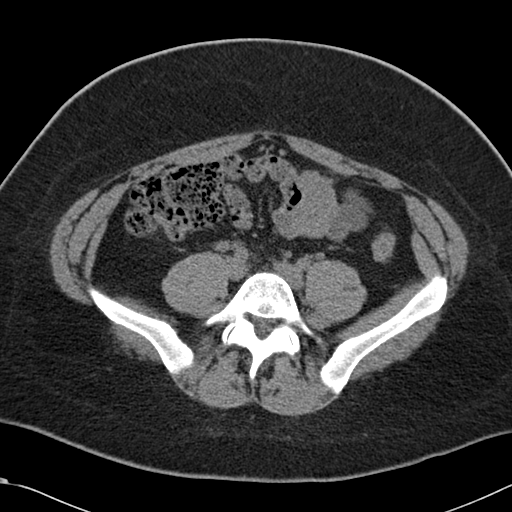
[im 78/156  soft-tissue]
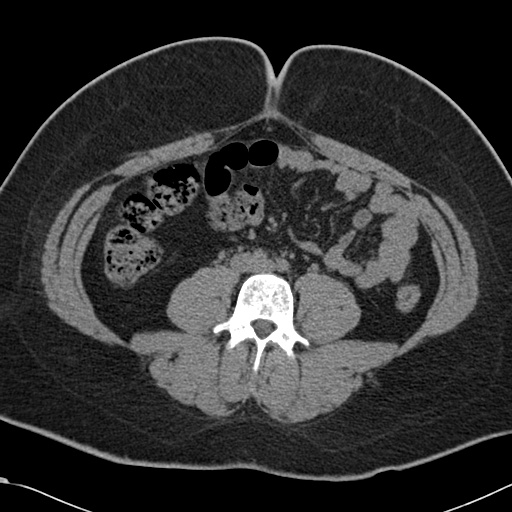
[im 90/156  soft-tissue]
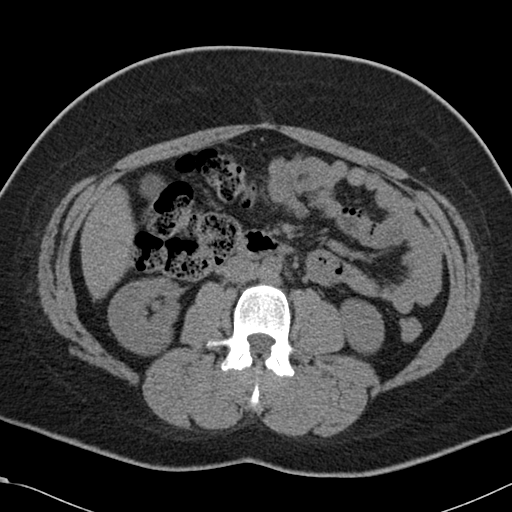
[im 102/156  soft-tissue]
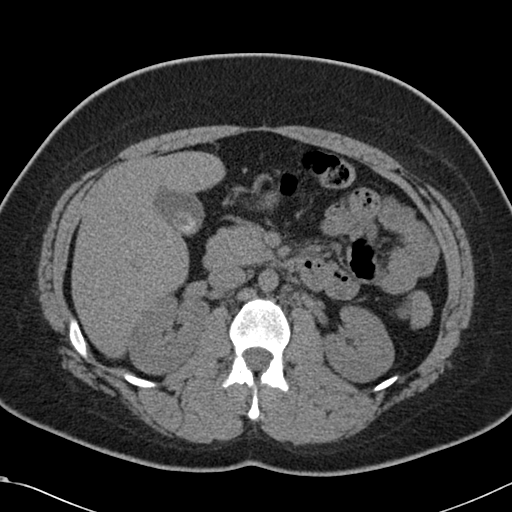
[im 102/156  bone]
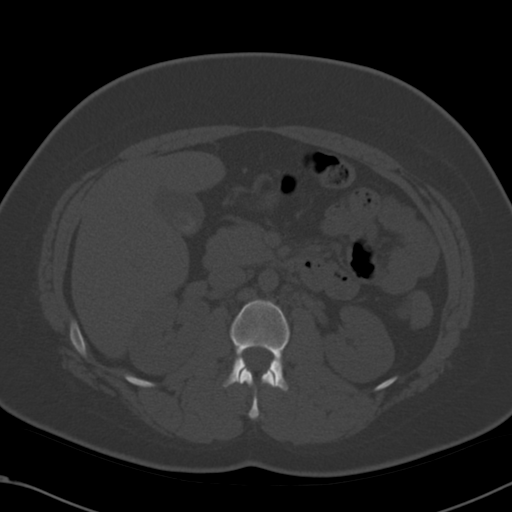
[im 114/156  soft-tissue]
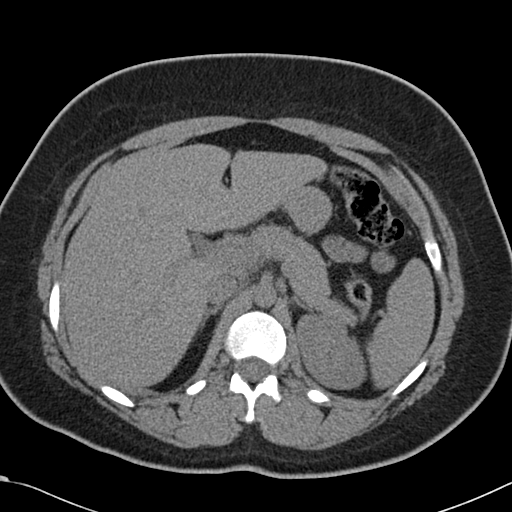
[im 126/156  soft-tissue]
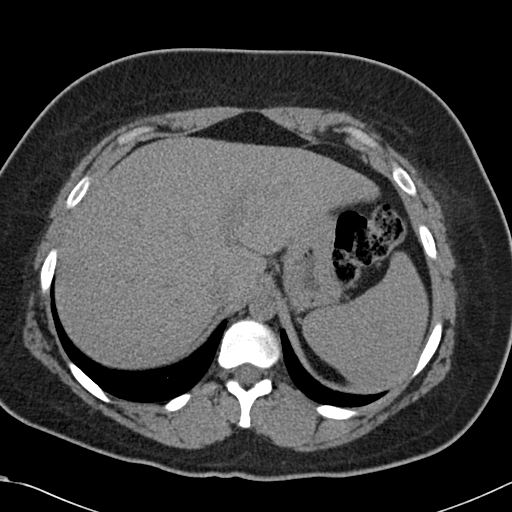
[im 132/156  lung]
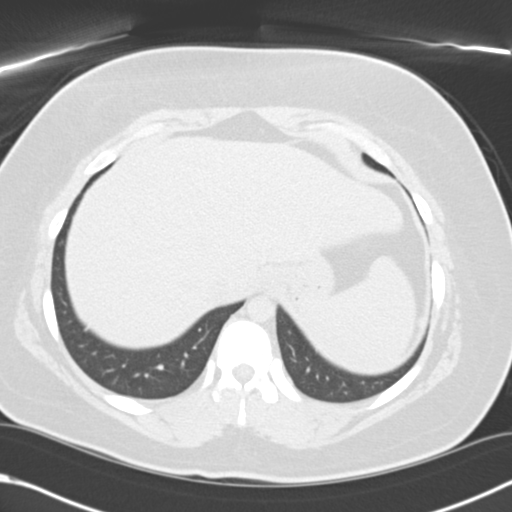
[im 138/156  soft-tissue]
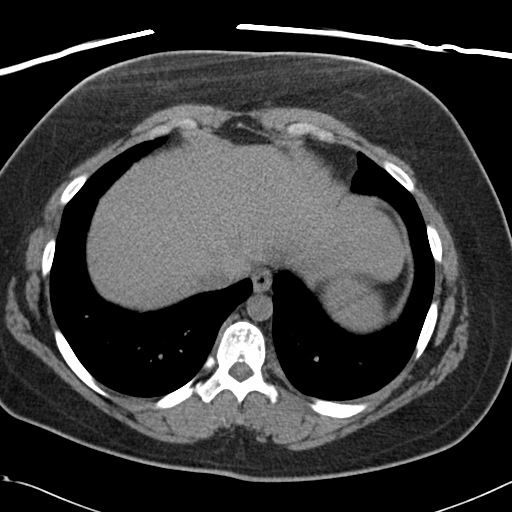
[im 138/156  lung]
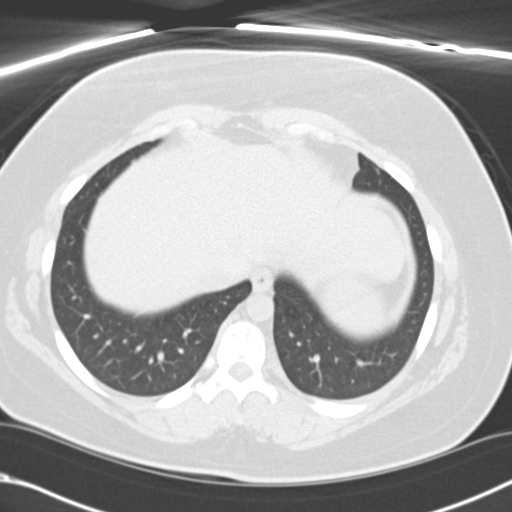
[im 144/156  lung]
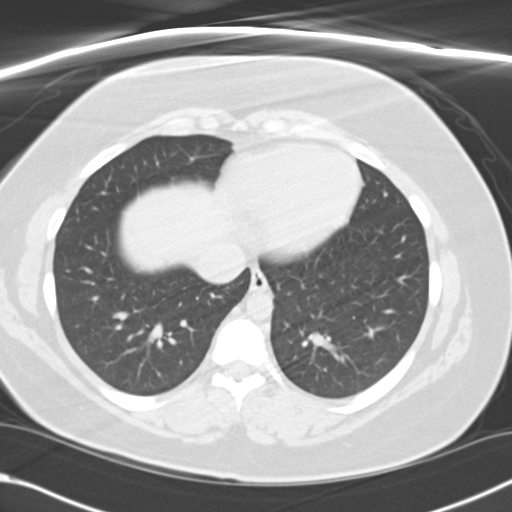
[im 150/156  soft-tissue]
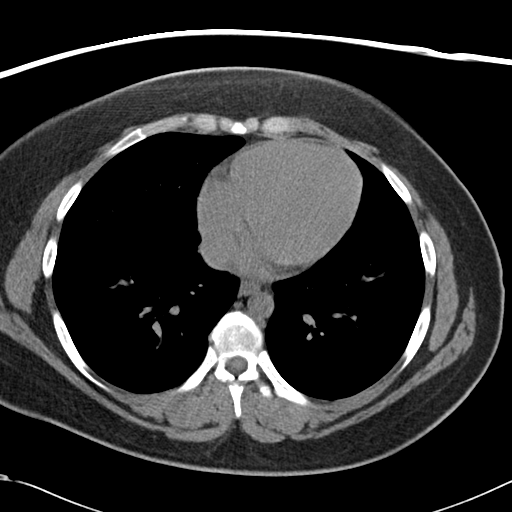
[im 150/156  lung]
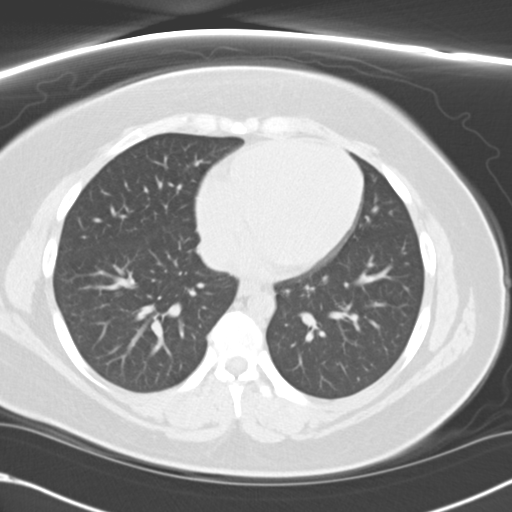

[15 of 32 positions shown; findings below may reference images not displayed]

FINDINGS: Lack of intravenous contrast limits evaluation of the solid abdominal
organs.  Grossly, the liver, spleen, pancreas, and adrenals are
unremarkable. There is a 1.8 cm calcified gallstone in the gallbladder.

No hydronephrosis. No renal calculi identified. No perinephric stranding. No
ureterectasis.

The appendix is normal. There is a 2.7 cm cyst associated with the right
ovary. The small and large bowel are normal in caliber. Mild thickening of
the bladder wall is felt to be related to underdistention. No mesenteric or
retroperitoneal lymphadenopathy. There is a retroaortic left renal vein.

No aggressive lytic or sclerotic osseous lesions identified. There are
findings consistent with osteitis condensans ilii.
IMPRESSION: 1. No urinary calculi or hydronephrosis.
2. Cholelithiasis.

## 2011-12-27 ENCOUNTER — Encounter: Payer: BC Managed Care – PPO | Admitting: Family Medicine

## 2012-01-17 ENCOUNTER — Encounter: Payer: BC Managed Care – PPO | Admitting: Family Medicine

## 2012-01-18 IMAGING — CR DG CHOLANGIOGRAM OPERATIVE
1 series · 1 of 1 positions shown · non-contrast
Comparison: none

REASON FOR EXAM: cholelithiasis
COMMENTS:

PROCEDURE:     DXR - DXR CHOLANGIOGRAM OP (INITIAL)  - November 02, 2010  [DATE]
RESULT:     Comparison: None.

[[id]]
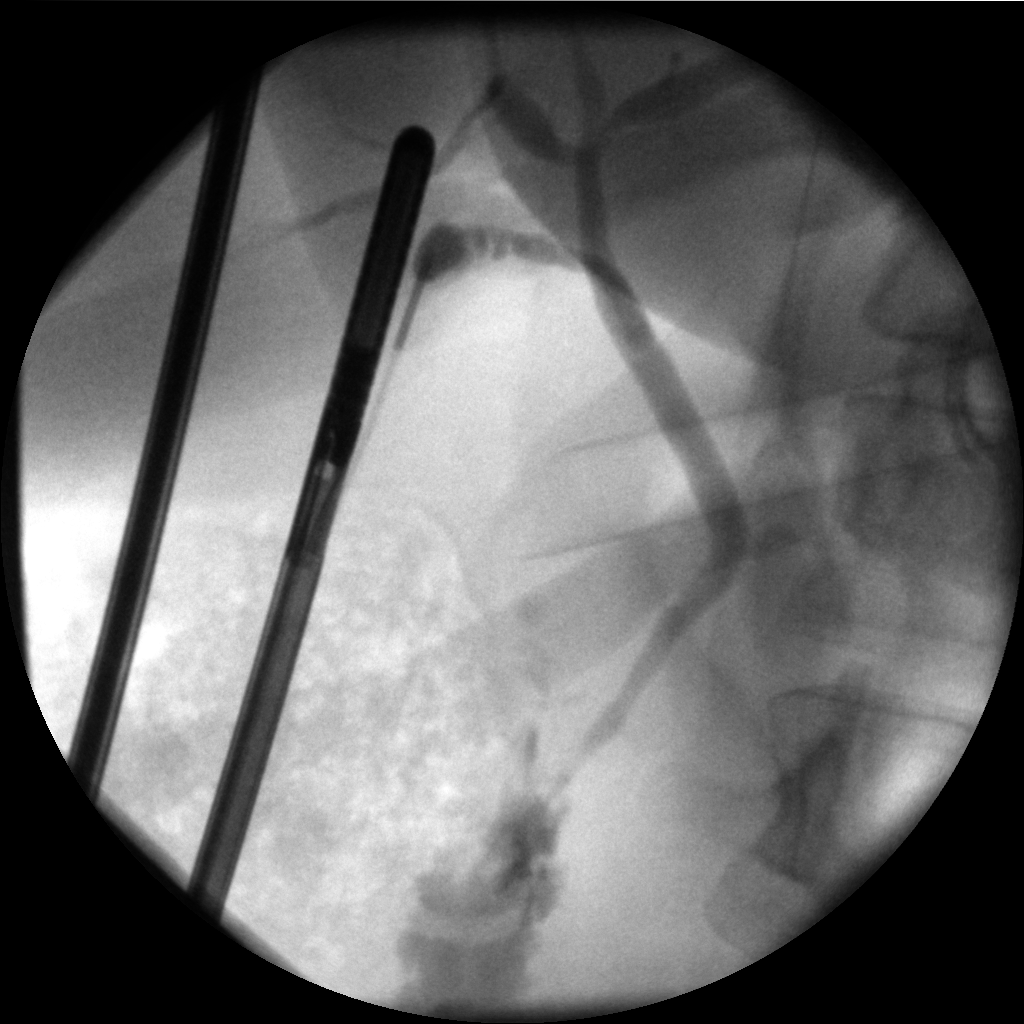

[1 of 1 positions shown; findings below may reference images not displayed]

FINDINGS: A single fluoroscopic image was noted from intraoperative cholangiogram.
Contrast material is seen filling the common bile duct, cystic duct remnant,
and the central intrahepatic biliary ducts. No filling defects identified.
There is mild, smooth tapering of the mid and distal common bile duct.
Contrast material seen within the duodenum. The remainder of the
interpretation is deferred to the performing clinician.
IMPRESSION: See above.

## 2012-01-29 ENCOUNTER — Encounter: Payer: BC Managed Care – PPO | Admitting: Family Medicine

## 2012-03-14 ENCOUNTER — Encounter: Payer: Self-pay | Admitting: Family Medicine

## 2012-03-14 ENCOUNTER — Other Ambulatory Visit (HOSPITAL_COMMUNITY)
Admission: RE | Admit: 2012-03-14 | Discharge: 2012-03-14 | Disposition: A | Discharge status: Home / Self Care | Payer: BC Managed Care – PPO | Source: Ambulatory Visit | Attending: Family Medicine | Admitting: Family Medicine

## 2012-03-14 ENCOUNTER — Ambulatory Visit (INDEPENDENT_AMBULATORY_CARE_PROVIDER_SITE_OTHER): Payer: BC Managed Care – PPO | Admitting: Family Medicine

## 2012-03-14 VITALS — BP 124/84 | HR 68 | Temp 98.0°F | Ht 61.25 in | Wt 215.0 lb

## 2012-03-14 DIAGNOSIS — Z0000 Encounter for general adult medical examination without abnormal findings: Secondary | ICD-10-CM | POA: Insufficient documentation

## 2012-03-14 DIAGNOSIS — R030 Elevated blood-pressure reading, without diagnosis of hypertension: Secondary | ICD-10-CM

## 2012-03-14 DIAGNOSIS — Z01419 Encounter for gynecological examination (general) (routine) without abnormal findings: Secondary | ICD-10-CM | POA: Insufficient documentation

## 2012-03-14 DIAGNOSIS — E785 Hyperlipidemia, unspecified: Secondary | ICD-10-CM

## 2012-03-14 DIAGNOSIS — D509 Iron deficiency anemia, unspecified: Secondary | ICD-10-CM

## 2012-03-14 DIAGNOSIS — Z113 Encounter for screening for infections with a predominantly sexual mode of transmission: Secondary | ICD-10-CM

## 2012-03-14 DIAGNOSIS — E039 Hypothyroidism, unspecified: Secondary | ICD-10-CM

## 2012-03-14 LAB — CBC WITH DIFFERENTIAL/PLATELET
Basophils Absolute: 0 10*3/uL (ref 0.0–0.1)
Hemoglobin: 13.4 g/dL (ref 12.0–15.0)
Lymphocytes Relative: 28.5 % (ref 12.0–46.0)
Monocytes Relative: 8.4 % (ref 3.0–12.0)
Neutro Abs: 3.1 10*3/uL (ref 1.4–7.7)
Platelets: 218 10*3/uL (ref 150.0–400.0)
RDW: 12.5 % (ref 11.5–14.6)

## 2012-03-14 LAB — COMPREHENSIVE METABOLIC PANEL
BUN: 14 mg/dL (ref 6–23)
CO2: 22 mEq/L (ref 19–32)
Creatinine, Ser: 0.7 mg/dL (ref 0.4–1.2)
GFR: 105.74 mL/min (ref >60.00)
Glucose, Bld: 86 mg/dL (ref 70–99)
Total Bilirubin: 0.3 mg/dL (ref 0.3–1.2)

## 2012-03-14 LAB — TSH: TSH: 4.2 u[IU]/mL (ref 0.35–5.50)

## 2012-03-14 LAB — LIPID PANEL
HDL: 53.1 mg/dL (ref >39.00)
Triglycerides: 198 mg/dL — ABNORMAL HIGH (ref 0.0–149.0)

## 2012-03-14 NOTE — Patient Instructions (Signed)
It was great to see you. We will call you with your lab results from today. Have a great summer!

## 2012-03-14 NOTE — Progress Notes (Signed)
26 yo here for CPX.  G0- no h/o abnormal pap smears.  Sexually active with husband only.  Has been working with a Psychologist, educational, feels good-- losing weight and no longer tired.  Hypothyroidism - on Synthroid 50 mcg daily. Lab Results  Component Value Date   TSH 3.82 04/05/2011  Denies any palpitations, changes in her hair bowels. No CP or SOB.    Menorrhagia- h/o iron deficiency anemia (required blood transfusion in 2009). On Helen Hashimoto which is controlling her periods. Lab Results  Component Value Date   WBC 6.8 04/05/2011   HGB 12.4 04/05/2011   HCT 35.9* 04/05/2011   MCV 92.7 04/05/2011   PLT 268.0 04/05/2011     Patient Active Problem List  Diagnoses  . Unspecified hypothyroidism  . HYPERTRIGLYCERIDEMIA  . HYPERLIPIDEMIA  . ANEMIA-IRON DEFICIENCY  . FATIGUE  . ELEVATED BP READING WITHOUT DX HYPERTENSION  . IRON DEFICIENCY ANEMIA, HX OF  . CHOLELITHIASIS  . Contact dermatitis, allergic  . URI (upper respiratory infection)  . Routine general medical examination at a health care facility   Past Medical History  Diagnosis Date  . Thyroid disease   . Anemia     iron deficiency   No past surgical history on file. History  Substance Use Topics  . Smoking status: Never Smoker   . Smokeless tobacco: Not on file  . Alcohol Use: Yes   Family History  Problem Relation Age of Onset  . Hypertension Mother   . Diabetes Mother    No Known Allergies Current Outpatient Prescriptions on File Prior to Visit  Medication Sig Dispense Refill  . drospirenone-ethinyl estradiol (YAZ) 3-0.02 MG per tablet Take 1 tablet by mouth daily.  90 tablet  2  . gemfibrozil (LOPID) 600 MG tablet Take 1 tablet (600 mg total) by mouth 2 (two) times daily.  180 tablet  2  . levothyroxine (SYNTHROID, LEVOTHROID) 50 MCG tablet Take 1 tablet (50 mcg total) by mouth daily.  90 tablet  2  . Omega-3 Fatty Acids (FISH OIL) 1000 MG CAPS Take 1 capsule by mouth at bedtime.        . Prenatal Vit-DSS-Fe Cbn-FA (PRENATAL  MULTIVITAMIN-ULTRA PO) Take 1 tablet by mouth daily.          The PMH, PSH, Social History, Family History, Medications, and allergies have been reviewed in [facility], and have been updated if relevant.   Review of Systems       See HPI Patient reports no  vision/ hearing changes,anorexia, weight change, fever ,adenopathy, persistant / recurrent hoarseness, swallowing issues, chest pain, edema,persistant / recurrent cough, hemoptysis, dyspnea(rest, exertional, paroxysmal nocturnal), gastrointestinal  bleeding (melena, rectal bleeding), abdominal pain, excessive heart burn, GU symptoms(dysuria, hematuria, pyuria, voiding/incontinence  Issues) syncope, focal weakness, severe memory loss, concerning skin lesions, depression, anxiety, abnormal bruising/bleeding, major joint swelling, breast masses or abnormal vaginal bleeding.     Physical Exam BP 124/84  Pulse 68  Temp(Src) 98 F (36.7 C) (Oral)  Ht 5' 1.25" (1.556 m)  Wt 215 lb (97.523 kg)  BMI 40.29 kg/m2  LMP 02/14/2012 Wt Readings from Last 3 Encounters:  03/14/12 215 lb (97.523 kg)  12/04/11 218 lb 8 oz (99.111 kg)  04/05/11 214 lb (97.07 kg)     General:  Well-developed,well-nourished,in no acute distress; alert,appropriate and cooperative throughout examination Head:  normocephalic and atraumatic.   Eyes:  vision grossly intact, pupils equal, pupils round, and pupils reactive to light.   Ears:  R ear normal and L ear normal.  Nose:  no external deformity.   Mouth:  good dentition.   Neck:  No deformities, masses, or tenderness noted. Breasts:  No mass, nodules, thickening, tenderness, bulging, retraction, inflamation, nipple discharge or skin changes noted.   Lungs:  Normal respiratory effort, chest expands symmetrically. Lungs are clear to auscultation, no crackles or wheezes. Heart:  Normal rate and regular rhythm. S1 and S2 normal without gallop, murmur, click, rub or other extra sounds. Abdomen:  Bowel sounds positive,abdomen  soft and non-tender without masses, organomegaly or hernias noted. Rectal:  no external abnormalities.   Genitalia:  Pelvic Exam:        External: normal female genitalia without lesions or masses        Vagina: normal without lesions or masses        Cervix: normal without lesions or masses        Adnexa: normal bimanual exam without masses or fullness        Uterus: normal by palpation        Pap smear: performed Msk:  No deformity or scoliosis noted of thoracic or lumbar spine.   Extremities:  No clubbing, cyanosis, edema, or deformity noted with normal full range of motion of all joints.   Neurologic:  alert & oriented X3 and gait normal.   Skin:  Intact without suspicious lesions or rashes Cervical Nodes:  No lymphadenopathy noted Axillary Nodes:  No palpable lymphadenopathy Psych:  Cognition and judgment appear intact. Alert and cooperative with normal attention span and concentration. No apparent delusions, illusions, hallucinations  Assessment and Plan: 1. Routine general medical examination at a health care facility  Reviewed preventive care protocols, scheduled due services, and updated immunizations Discussed nutrition, exercise, diet, and healthy lifestyle.  Cytology - PAP, Comprehensive metabolic panel  2. ANEMIA-IRON DEFICIENCY  CBC with Differential  3. Unspecified hypothyroidism  TSH  4. HYPERLIPIDEMIA  Lipid panel  5. ELEVATED BP READING WITHOUT DX HYPERTENSION   Normotensive today.  No rx needed.   7. Routine gynecological examination  HIV Antibody, RPR

## 2012-03-15 LAB — RPR

## 2012-03-15 LAB — HIV ANTIBODY (ROUTINE TESTING W REFLEX): HIV: NONREACTIVE

## 2012-03-19 ENCOUNTER — Encounter: Payer: Self-pay | Admitting: Family Medicine

## 2012-03-19 LAB — HM PAP SMEAR: HM Pap smear: NORMAL

## 2012-03-21 ENCOUNTER — Encounter: Payer: BC Managed Care – PPO | Admitting: Family Medicine

## 2012-03-26 ENCOUNTER — Other Ambulatory Visit: Payer: Self-pay | Admitting: Family Medicine

## 2012-04-08 ENCOUNTER — Other Ambulatory Visit: Payer: Self-pay | Admitting: Family Medicine

## 2012-04-24 ENCOUNTER — Telehealth: Payer: Self-pay

## 2012-04-24 NOTE — Telephone Encounter (Signed)
Pt wants to stop Lopid and Levothyroxine until next lab appt 09/13/12. Pt has personal trainer; exercising 1 hr a day 5-6 days a week & following diet plan by trainer.Please advise.

## 2012-04-24 NOTE — Telephone Encounter (Signed)
If she wants to stop it, then I recommend labs sooner than 11/13. I would recommend repeat lipid panel and TSH in 8-12 weeks.

## 2012-04-25 NOTE — Telephone Encounter (Signed)
Left message asking pt to call back. 

## 2012-04-25 NOTE — Telephone Encounter (Signed)
Advised patient, lab appt scheduled for 07/05/12.

## 2012-05-27 ENCOUNTER — Telehealth: Payer: Self-pay | Admitting: *Deleted

## 2012-05-27 NOTE — Telephone Encounter (Signed)
Pt called to let you know that she is pregnant and her OB put her back on thyroid medicine because her levels are off.

## 2012-05-27 NOTE — Telephone Encounter (Signed)
Noted! Thank you

## 2012-07-05 ENCOUNTER — Other Ambulatory Visit (INDEPENDENT_AMBULATORY_CARE_PROVIDER_SITE_OTHER): Payer: BC Managed Care – PPO

## 2012-07-05 DIAGNOSIS — E785 Hyperlipidemia, unspecified: Secondary | ICD-10-CM

## 2012-07-05 DIAGNOSIS — E039 Hypothyroidism, unspecified: Secondary | ICD-10-CM

## 2012-07-05 LAB — LIPID PANEL
HDL: 51.9 mg/dL (ref >39.00)
LDL Cholesterol: 53 mg/dL (ref 0–99)
Total CHOL/HDL Ratio: 3
Triglycerides: 155 mg/dL — ABNORMAL HIGH (ref 0.0–149.0)

## 2012-07-05 LAB — TSH: TSH: 3.09 u[IU]/mL (ref 0.35–5.50)

## 2012-07-08 ENCOUNTER — Encounter: Payer: Self-pay | Admitting: *Deleted

## 2012-07-09 ENCOUNTER — Telehealth: Payer: Self-pay | Admitting: *Deleted

## 2012-07-09 NOTE — Telephone Encounter (Signed)
Thank you :)

## 2012-07-09 NOTE — Telephone Encounter (Signed)
Pt called to let you know that she is pregnant and will be seeing a doctor at [facility], who will be following her thyroid as well.  I asked her to have them send all lab results to you.

## 2012-09-04 LAB — US OB COMP + 14 WK

## 2012-09-11 ENCOUNTER — Telehealth: Payer: Self-pay

## 2012-09-11 NOTE — Telephone Encounter (Signed)
Left message advising patient

## 2012-09-11 NOTE — Telephone Encounter (Signed)
Pt left v/m; pt cancelled lab 09/13/12 for C met; pt is presently pregnant; OB to send lab report to Dr Dayton Martes also. Pt wants to know if that is OK?Please advise.

## 2012-09-11 NOTE — Telephone Encounter (Signed)
Yes that is ok.  Congratulations!

## 2012-09-13 ENCOUNTER — Other Ambulatory Visit: Payer: BC Managed Care – PPO

## 2012-10-30 DIAGNOSIS — N63 Unspecified lump in unspecified breast: Secondary | ICD-10-CM

## 2012-10-30 HISTORY — DX: Unspecified lump in unspecified breast: N63.0

## 2012-12-06 ENCOUNTER — Observation Stay: Payer: Self-pay | Admitting: Obstetrics & Gynecology

## 2013-01-19 ENCOUNTER — Inpatient Hospital Stay: Payer: Self-pay | Admitting: Obstetrics and Gynecology

## 2013-01-19 LAB — CBC WITH DIFFERENTIAL/PLATELET
Basophil #: 0 10*3/uL (ref 0.0–0.1)
Basophil %: 0.5 %
Eosinophil #: 0.1 10*3/uL (ref 0.0–0.7)
HCT: 37.2 % (ref 35.0–47.0)
HGB: 13.1 g/dL (ref 12.0–16.0)
Lymphocyte %: 22.6 %
MCHC: 35.2 g/dL (ref 32.0–36.0)
MCV: 93 fL (ref 80–100)
Monocyte #: 0.9 x10 3/mm (ref 0.2–0.9)
Monocyte %: 8.3 %
Neutrophil %: 68 %
RBC: 4.01 10*6/uL (ref 3.80–5.20)
RDW: 13.9 % (ref 11.5–14.5)
WBC: 10.4 10*3/uL (ref 3.6–11.0)

## 2013-01-21 LAB — PLATELET COUNT: Platelet: 144 10*3/uL — ABNORMAL LOW (ref 150–440)

## 2013-03-12 ENCOUNTER — Telehealth: Payer: Self-pay | Admitting: Family Medicine

## 2013-03-12 NOTE — Telephone Encounter (Signed)
Patient Information:  Caller Name: Thamar  Phone: [phone]  Patient: Kimberly Garner, Kimberly Garner  Gender: Female  DOB: 09/07/1986  Age: 27 Years  PCP: Ruthe Mannan [facility])  Pregnant: No  Office Follow Up:  Does the office need to follow up with this patient?: No  Instructions For The Office: N/A   Symptoms  Reason For Call & Symptoms: Pt sees bright red blood streaks on stools x 3 days.  No abdominal pain.  Stools are hard and bigger than usual. Stools are hard and pt battling w/constipatin recently after birth of her child.  .  Reviewed Health History In EMR: Yes  Reviewed Medications In EMR: Yes  Reviewed Allergies In EMR: Yes  Reviewed Surgeries / Procedures: Yes  Date of Onset of Symptoms: 03/09/2013 OB / GYN:  LMP: Unknown  Guideline(s) Used:  Rectal Bleeding  Disposition Per Guideline:   See Within 2 Weeks in Office  Reason For Disposition Reached:   Rectal bleeding is minimal (e.g., blood just on toilet paper, a few drops in toilet bowl)  Advice Given:  Warm SITZ Baths Twice a Day:   Sit in a warm saline bath for 20 minutes 2 times daily to cleanse the rectal area and to promote healing.  General Instructions for Treating and Preventing Constipation:   Eat a high fiber diet.  Drink adequate liquids.  Exercise regularly (even a daily 15 minute walk!).  Get into a rhythm - try to have a BM at the same time each day.  Avoid enemas and stimulant laxatives.  High Fiber Diet:  Try to eat fresh fruit and vegetables at each meal (peas, prunes, citrus, apples, beans, corn).  Eat more grain foods (bran flakes, bran muffins, graham crackers, oatmeal, brown rice, and whole wheat bread).  Treatment of Rectal Itching and Irritation:  Cleansing after a Bowel Movement - Cleanse the anus with warm water after each bowel movement. Use wet cotton or tissue. Pat the area dry using unscented toilet paper. Avoid rubbing area with toilet paper.  Topical Hydrocortisone Cream Twice  a Day - Apply 1% hydrocortisone ointment (OTC) bid to reduce irritation (e.g., Anusol HC, Preparation H Hydrocortisone).  Call Back If:  Bleeding increases in amount  Bleeding occurs 3 or more times after treatment begins  You become worse.  RN Overrode Recommendation:  Follow Up With Office Later  Caller will f/u is symptoms do not improve with homecare

## 2013-03-27 ENCOUNTER — Ambulatory Visit: Payer: Self-pay | Admitting: General Surgery

## 2013-04-07 ENCOUNTER — Encounter: Payer: Self-pay | Admitting: General Surgery

## 2013-04-07 ENCOUNTER — Ambulatory Visit (INDEPENDENT_AMBULATORY_CARE_PROVIDER_SITE_OTHER): Payer: BC Managed Care – PPO | Admitting: General Surgery

## 2013-04-07 VITALS — BP 110/82 | HR 80 | Resp 14 | Ht 62.0 in | Wt 227.0 lb

## 2013-04-07 DIAGNOSIS — N63 Unspecified lump in unspecified breast: Secondary | ICD-10-CM | POA: Insufficient documentation

## 2013-04-07 NOTE — Patient Instructions (Addendum)
Patient to return in 6 weeks for follow up.

## 2013-04-07 NOTE — Progress Notes (Signed)
Patient ID: Kimberly Garner, female   DOB: [birth year], 27 y.o.   MRN: [id number]  Chief Complaint  Patient presents with  . Breast Problem    breast mass 1 o'clock    HPI Kimberly Garner is a 27 y.o. female who presents for a left breast lump. The patient states she had mastitis when her son was 3 month old-in April this yr. She states the area has gotten smaller but is not experiencing any pain at this time. Initially the area was red and painful. Was treated with antibiotic.No family history of breast cancer. She does check herself regularly and has had no mammograms before. She denies any breast surgeries or problems in the past. She is 3 mos post partum now, and says she is not lactating.  HPI  Past Medical History  Diagnosis Date  . Thyroid disease   . Anemia     iron deficiency  . Breast lump 2014    Past Surgical History  Procedure Laterality Date  . Cesarean section  2014  . Fetal blood transfusion  2010  . Cholecystectomy  2012  . Foot surgery  2013    Family History  Problem Relation Age of Onset  . Hypertension Mother   . Diabetes Mother     Social History History  Substance Use Topics  . Smoking status: Never Smoker   . Smokeless tobacco: Never Used  . Alcohol Use: Yes    No Known Allergies  Current Outpatient Prescriptions  Medication Sig Dispense Refill  . estradiol (ESTRACE) 1 MG tablet Take 1 mg by mouth daily.      . Etonogestrel (NEXPLANON [place]) Inject into the skin. Birth Control - Arm implant      . levothyroxine (SYNTHROID, LEVOTHROID) 50 MCG tablet TAKE 1 TABLET ( TOTAL) BY MOUTH DAILY  90 tablet  3  . Prenatal Vit-DSS-Fe Cbn-FA (PRENATAL MULTIVITAMIN-ULTRA PO) Take 1 tablet by mouth daily.         No current facility-administered medications for this visit.    Review of Systems Review of Systems  Constitutional: Negative.   Respiratory: Negative.   Cardiovascular: Negative.     Blood pressure 110/82, pulse 80, resp. rate 14, height 5'  2" (1.575 m), weight 227 lb (102.967 kg), last menstrual period 04/04/2013.  Physical Exam Physical Exam  Constitutional: She appears well-developed and well-nourished.  Eyes: Conjunctivae are normal. No scleral icterus.  Neck: Trachea normal. No mass and no thyromegaly present.  Pulmonary/Chest: Right breast exhibits no inverted nipple, no mass, no nipple discharge, no skin change and no tenderness. Left breast exhibits no inverted nipple, no mass, no nipple discharge, no skin change and no tenderness. Breasts are symmetrical.  No mass palpable in left breast 1 o'cl as noted before by pt and Dr. Luella Cook.   Lymphadenopathy:    She has no cervical adenopathy.    She has no axillary adenopathy.    Data Reviewed None  Assessment    It appears the previous mass has resolved. Likely this was related to her mastitis.    Plan    Patient to return in 6 weeks.  Will plan for [place] of left breast at that time.       SANKAR,SEEPLAPUTHUR G 04/07/2013, 9:34 PM

## 2013-05-19 ENCOUNTER — Ambulatory Visit: Payer: BC Managed Care – PPO | Admitting: General Surgery

## 2013-05-20 ENCOUNTER — Ambulatory Visit: Payer: BC Managed Care – PPO | Admitting: General Surgery

## 2013-05-20 ENCOUNTER — Other Ambulatory Visit: Payer: Self-pay | Admitting: Family Medicine

## 2013-06-24 ENCOUNTER — Other Ambulatory Visit: Payer: Self-pay | Admitting: Family Medicine

## 2013-06-24 DIAGNOSIS — E785 Hyperlipidemia, unspecified: Secondary | ICD-10-CM

## 2013-06-24 DIAGNOSIS — Z0000 Encounter for general adult medical examination without abnormal findings: Secondary | ICD-10-CM

## 2013-06-24 DIAGNOSIS — E039 Hypothyroidism, unspecified: Secondary | ICD-10-CM

## 2013-06-24 DIAGNOSIS — Z862 Personal history of diseases of the blood and blood-forming organs and certain disorders involving the immune mechanism: Secondary | ICD-10-CM

## 2013-07-01 ENCOUNTER — Other Ambulatory Visit: Payer: BC Managed Care – PPO

## 2013-07-02 ENCOUNTER — Other Ambulatory Visit: Payer: BC Managed Care – PPO

## 2013-07-03 ENCOUNTER — Other Ambulatory Visit (INDEPENDENT_AMBULATORY_CARE_PROVIDER_SITE_OTHER): Payer: No Typology Code available for payment source

## 2013-07-03 DIAGNOSIS — E039 Hypothyroidism, unspecified: Secondary | ICD-10-CM

## 2013-07-03 DIAGNOSIS — E785 Hyperlipidemia, unspecified: Secondary | ICD-10-CM

## 2013-07-03 DIAGNOSIS — D509 Iron deficiency anemia, unspecified: Secondary | ICD-10-CM

## 2013-07-03 DIAGNOSIS — Z0000 Encounter for general adult medical examination without abnormal findings: Secondary | ICD-10-CM

## 2013-07-03 DIAGNOSIS — E781 Pure hyperglyceridemia: Secondary | ICD-10-CM

## 2013-07-03 DIAGNOSIS — R5381 Other malaise: Secondary | ICD-10-CM

## 2013-07-03 DIAGNOSIS — R030 Elevated blood-pressure reading, without diagnosis of hypertension: Secondary | ICD-10-CM

## 2013-07-03 DIAGNOSIS — Z862 Personal history of diseases of the blood and blood-forming organs and certain disorders involving the immune mechanism: Secondary | ICD-10-CM

## 2013-07-03 LAB — CBC WITH DIFFERENTIAL/PLATELET
Basophils Absolute: 0 10*3/uL (ref 0.0–0.1)
Eosinophils Relative: 1.5 % (ref 0.0–5.0)
HCT: 39.8 % (ref 36.0–46.0)
Hemoglobin: 13.5 g/dL (ref 12.0–15.0)
Lymphocytes Relative: 23.8 % (ref 12.0–46.0)
Monocytes Relative: 6.6 % (ref 3.0–12.0)
Neutro Abs: 5.3 10*3/uL (ref 1.4–7.7)
RDW: 12.4 % (ref 11.5–14.6)
WBC: 7.8 10*3/uL (ref 4.5–10.5)

## 2013-07-03 LAB — TSH: TSH: 3.39 u[IU]/mL (ref 0.35–5.50)

## 2013-07-03 LAB — COMPREHENSIVE METABOLIC PANEL
ALT: 26 U/L (ref 0–35)
Albumin: 3.8 g/dL (ref 3.5–5.2)
CO2: 26 mEq/L (ref 19–32)
Calcium: 9 mg/dL (ref 8.4–10.5)
Chloride: 107 mEq/L (ref 96–112)
GFR: 118.02 mL/min (ref >60.00)
Glucose, Bld: 91 mg/dL (ref 70–99)
Potassium: 4.2 mEq/L (ref 3.5–5.1)
Sodium: 137 mEq/L (ref 135–145)
Total Bilirubin: 0.5 mg/dL (ref 0.3–1.2)
Total Protein: 6.9 g/dL (ref 6.0–8.3)

## 2013-07-03 LAB — T4, FREE: Free T4: 0.76 ng/dL (ref 0.60–1.60)

## 2013-07-03 LAB — LIPID PANEL
Cholesterol: 145 mg/dL (ref 0–200)
VLDL: 17.2 mg/dL (ref 0.0–40.0)

## 2013-07-08 ENCOUNTER — Encounter: Payer: Self-pay | Admitting: Family Medicine

## 2013-07-08 ENCOUNTER — Ambulatory Visit (INDEPENDENT_AMBULATORY_CARE_PROVIDER_SITE_OTHER): Payer: No Typology Code available for payment source | Admitting: Family Medicine

## 2013-07-08 VITALS — BP 102/60 | HR 80 | Temp 98.3°F | Ht 61.5 in | Wt 231.0 lb

## 2013-07-08 DIAGNOSIS — E039 Hypothyroidism, unspecified: Secondary | ICD-10-CM

## 2013-07-08 DIAGNOSIS — Z23 Encounter for immunization: Secondary | ICD-10-CM

## 2013-07-08 DIAGNOSIS — E785 Hyperlipidemia, unspecified: Secondary | ICD-10-CM

## 2013-07-08 DIAGNOSIS — Z0000 Encounter for general adult medical examination without abnormal findings: Secondary | ICD-10-CM

## 2013-07-08 NOTE — Addendum Note (Signed)
Addended by: Eliezer Bottom on: 07/08/2013 09:43 AM   Modules accepted: Orders

## 2013-07-08 NOTE — Progress Notes (Signed)
27 yo here for CPX.  G1P74- Has four month old son.  S/p C section.  Back at work now.  Feels she is adjusting ok.  Emotionally feels pretty good but does feel a little moodier.  Does not want to harm her baby or her self.   Not breast feeding.  No h/o abnormal pap smears.  Last pap smear (done by me), normal in 02/2012. Sexually active with husband only.   Hypothyroidism - on Synthroid 50 mcg daily. Lab Results  Component Value Date   TSH 3.39 07/03/2013  Denies any palpitations, changes in her hair bowels. No CP or SOB.    Menorrhagia- h/o iron deficiency anemia (required blood transfusion in 2009). Has Nexplanon implant (since May 2014).  She is having some bleeding issues- OBGYN is managing this with OCPs. She is taking a PNV. Lab Results  Component Value Date   WBC 7.8 07/03/2013   HGB 13.5 07/03/2013   HCT 39.8 07/03/2013   MCV 91.2 07/03/2013   PLT 223.0 07/03/2013   Lab Results  Component Value Date   CHOL 145 07/03/2013   HDL 40.10 07/03/2013   LDLCALC 88 07/03/2013   LDLDIRECT 68.4 07/28/2010   TRIG 86.0 07/03/2013   CHOLHDL 4 07/03/2013     Patient Active Problem List   Diagnosis Date Noted  . Breast mass 04/07/2013  . Breast lump   . Routine general medical examination at a health care facility 03/14/2012  . Routine gynecological examination 03/14/2012  . CHOLELITHIASIS 10/10/2010  . HYPERTRIGLYCERIDEMIA 09/08/2010  . HYPERLIPIDEMIA 07/28/2010  . Unspecified hypothyroidism 04/25/2010  . ANEMIA-IRON DEFICIENCY 04/25/2010  . FATIGUE 04/25/2010  . ELEVATED BP READING WITHOUT DX HYPERTENSION 04/25/2010  . IRON DEFICIENCY ANEMIA, HX OF 04/25/2010   Past Medical History  Diagnosis Date  . Thyroid disease   . Anemia     iron deficiency  . Breast lump 2014   Past Surgical History  Procedure Laterality Date  . Cesarean section  2014  . Fetal blood transfusion  2010  . Cholecystectomy  2012  . Foot surgery  2013   History  Substance Use Topics  . Smoking status: Never  Smoker   . Smokeless tobacco: Never Used  . Alcohol Use: Yes   Family History  Problem Relation Age of Onset  . Hypertension Mother   . Diabetes Mother    No Known Allergies Current Outpatient Prescriptions on File Prior to Visit  Medication Sig Dispense Refill  . estradiol (ESTRACE) 1 MG tablet Take 1 mg by mouth daily.      . Etonogestrel (NEXPLANON [place]) Inject into the skin. Birth Control - Arm implant      . levothyroxine (SYNTHROID, LEVOTHROID) 50 MCG tablet TAKE 1 TABLET ( TOTAL) BY MOUTH DAILY  90 tablet  0  . Prenatal Vit-DSS-Fe Cbn-FA (PRENATAL MULTIVITAMIN-ULTRA PO) Take 1 tablet by mouth daily.         No current facility-administered medications on file prior to visit.    The PMH, PSH, Social History, Family History, Medications, and allergies have been reviewed in [facility], and have been updated if relevant.   Review of Systems       See HPI Patient reports no  vision/ hearing changes,anorexia, weight change, fever ,adenopathy, persistant / recurrent hoarseness, swallowing issues, chest pain, edema,persistant / recurrent cough, hemoptysis, dyspnea(rest, exertional, paroxysmal nocturnal), gastrointestinal  bleeding (melena, rectal bleeding), abdominal pain, excessive heart burn, GU symptoms(dysuria, hematuria, pyuria, voiding/incontinence  Issues) syncope, focal weakness, severe memory loss, concerning skin  lesions, depression, anxiety, abnormal bruising/bleeding, major joint swelling, breast masses or abnormal vaginal bleeding.     Physical Exam BP 102/60  Pulse 80  Temp(Src) 98.3 F (36.8 C)  Ht 5' 1.5" (1.562 m)  Wt 231 lb (104.781 kg)  BMI 42.95 kg/m2  Wt Readings from Last 3 Encounters:  07/08/13 231 lb (104.781 kg)  04/07/13 227 lb (102.967 kg)  03/14/12 215 lb (97.523 kg)    General:  Well-developed,well-nourished,in no acute distress; alert,appropriate and cooperative throughout examination Head:  normocephalic and atraumatic.   Eyes:  vision grossly  intact, pupils equal, pupils round, and pupils reactive to light.   Ears:  R ear normal and L ear normal.   Nose:  no external deformity.   Lungs:  Normal respiratory effort, chest expands symmetrically. Lungs are clear to auscultation, no crackles or wheezes. Heart:  Normal rate and regular rhythm. S1 and S2 normal without gallop, murmur, click, rub or other extra sounds. Abdomen:  Bowel sounds positive,abdomen soft and non-tender without masses, organomegaly or hernias noted. Msk:  No deformity or scoliosis noted of thoracic or lumbar spine.   Extremities:  No clubbing, cyanosis, edema, or deformity noted with normal full range of motion of all joints.   Neurologic:  alert & oriented X3 and gait normal.   Skin:  Intact without suspicious lesions or rashes Cervical Nodes:  No lymphadenopathy noted Axillary Nodes:  No palpable lymphadenopathy Psych:  Cognition and judgment appear intact. Alert and cooperative with normal attention span and concentration. No apparent delusions, illusions, hallucinations  Assessment and Plan:  1. HYPERLIPIDEMIA Lipid panel looks great.   2. Routine general medical examination at a health care facility Reviewed preventive care protocols, scheduled due services, and updated immunizations Discussed nutrition, exercise, diet, and healthy lifestyle. Flu shot given today.   3. Unspecified hypothyroidism No changes.  Thyroid panel excellent today.

## 2013-07-08 NOTE — Patient Instructions (Addendum)
Good to see you. Your labs are excellent. Keep in touch and congratulations on your son!

## 2013-10-14 ENCOUNTER — Other Ambulatory Visit: Payer: Self-pay | Admitting: Internal Medicine

## 2013-10-14 ENCOUNTER — Encounter: Payer: Self-pay | Admitting: Family Medicine

## 2013-10-14 MED ORDER — LEVOTHYROXINE SODIUM 50 MCG PO TABS: ORAL_TABLET | ORAL | Status: DC

## 2013-10-14 NOTE — Telephone Encounter (Signed)
Ok to fill 

## 2013-10-15 MED ORDER — LEVOTHYROXINE SODIUM 50 MCG PO TABS: ORAL_TABLET | ORAL | Status: DC

## 2013-10-15 NOTE — Telephone Encounter (Signed)
rx sent to pharmacy by e-script [facility]

## 2013-10-15 NOTE — Addendum Note (Signed)
Addended by: Sueanne Margarita on: 10/15/2013 09:29 AM   Modules accepted: Orders

## 2013-10-31 ENCOUNTER — Ambulatory Visit (INDEPENDENT_AMBULATORY_CARE_PROVIDER_SITE_OTHER): Payer: No Typology Code available for payment source | Admitting: Internal Medicine

## 2013-10-31 ENCOUNTER — Encounter: Payer: Self-pay | Admitting: Internal Medicine

## 2013-10-31 VITALS — BP 118/66 | HR 84 | Temp 98.4°F | Wt 235.0 lb

## 2013-10-31 DIAGNOSIS — J019 Acute sinusitis, unspecified: Secondary | ICD-10-CM

## 2013-10-31 DIAGNOSIS — R040 Epistaxis: Secondary | ICD-10-CM

## 2013-10-31 MED ORDER — AMOXICILLIN 875 MG PO TABS: 875.0000 mg | ORAL_TABLET | Freq: Two times a day (BID) | ORAL | Status: DC

## 2013-10-31 NOTE — Progress Notes (Signed)
Subjective:    Patient ID: Kimberly LownHeather Garner, female    DOB: 03/14/1986, 28 y.o.   MRN: [id number]  HPI  Pt presents to the clinic today with c/o a nose bleed. This has been intermittent over the last 2 days. She has had a lot of headaches, nasal congestion and drainage. This started about 3 weeks ago. She has taken Sudafed, and Mucinex. She has been taking Sudafed for almost 3 weeks now. She has also been using saline nasal spray to help coat her nostril. She has not had any fever. She has had sick contacts.  Review of Systems  Past Medical History  Diagnosis Date  . Thyroid disease   . Anemia     iron deficiency  . Breast lump 2014    Current Outpatient Prescriptions  Medication Sig Dispense Refill  . Etonogestrel (NEXPLANON [place]) Inject into the skin. Birth Control - Arm implant      . levothyroxine (SYNTHROID, LEVOTHROID) 50 MCG tablet TAKE 1 TABLET (50MCG TOTAL) BY MOUTH DAILY  90 tablet  0  . norgestimate-ethinyl estradiol (ORTHO-CYCLEN,SPRINTEC,PREVIFEM) 0.25-35 MG-MCG tablet Take 1 tablet by mouth daily.      . Prenatal Vit-DSS-Fe Cbn-FA (PRENATAL MULTIVITAMIN-ULTRA PO) Take 1 tablet by mouth daily.         No current facility-administered medications for this visit.    No Known Allergies  Family History  Problem Relation Age of Onset  . Hypertension Mother   . Diabetes Mother     History   Social History  . Marital Status: Married    Spouse Name: N/A    Number of Children: N/A  . Years of Education: N/A   Occupational History  . Not on file.   Social History Main Topics  . Smoking status: Never Smoker   . Smokeless tobacco: Never Used  . Alcohol Use: Yes  . Drug Use: No  . Sexual Activity: Not on file   Other Topics Concern  . Not on file   Social History Narrative   Works in front office at [employer]   Married     Constitutional: Denies fever, malaise, fatigue, or abrupt weight changes.  HEENT: Denies eye pain, eye redness, ear pain,  ringing in the ears, wax buildup, runny nose,   or sore throat. Neurological: Denies dizziness, difficulty with memory, difficulty with speech or problems with balance and coordination.   No other specific complaints in a complete review of systems (except as listed in HPI above).     Objective:   Physical Exam  BP 118/66  Pulse 84  Temp(Src) 98.4 F (36.9 C) (Oral)  Wt 235 lb (106.595 kg)  SpO2 98% Wt Readings from Last 3 Encounters:  10/31/13 235 lb (106.595 kg)  07/08/13 231 lb (104.781 kg)  04/07/13 227 lb (102.967 kg)    General: Appears her  stated age, well developed, well nourished in NAD.  HEENT: Head: normal shape and size; Eyes: sclera white, no icterus, conjunctiva pink, PERRLA and EOMs intact; Ears: Tm's gray and intact, normal light reflex; Nose: mucosa pink and very dry, septum midline; Throat/Mouth: Teeth present, mucosa pink and moist, no exudate, lesions or ulcerations noted.  Cardiovascular: Normal rate and rhythm. S1,S2 noted.  No murmur, rubs or gallops noted. No JVD or BLE edema. No carotid bruits noted. Pulmonary/Chest: Normal effort and positive vesicular breath sounds. No respiratory distress. No wheezes, rales or ronchi noted.     BMET    Component Value Date/Time   NA 137 07/03/2013  0743   K 4.2 07/03/2013 0743   CL 107 07/03/2013 0743   CO2 26 07/03/2013 0743   GLUCOSE 91 07/03/2013 0743   BUN 12 07/03/2013 0743   CREATININE 0.6 07/03/2013 0743   CALCIUM 9.0 07/03/2013 0743   GFRNONAA 86.06 04/25/2010 1422    Lipid Panel     Component Value Date/Time   CHOL 145 07/03/2013 0743   TRIG 86.0 07/03/2013 0743   HDL 40.10 07/03/2013 0743   CHOLHDL 4 07/03/2013 0743   VLDL 17.2 07/03/2013 0743   LDLCALC 88 07/03/2013 0743    CBC    Component Value Date/Time   WBC 7.8 07/03/2013 0743   RBC 4.36 07/03/2013 0743   HGB 13.5 07/03/2013 0743   HCT 39.8 07/03/2013 0743   PLT 223.0 07/03/2013 0743   MCV 91.2 07/03/2013 0743   MCHC 34.0 07/03/2013 0743   RDW 12.4 07/03/2013 0743    LYMPHSABS 1.8 07/03/2013 0743   MONOABS 0.5 07/03/2013 0743   EOSABS 0.1 07/03/2013 0743   BASOSABS 0.0 07/03/2013 0743    Hgb A1C No results found for this basename: HGBA1C         Assessment & Plan:   Acute sinusitis:  Nose bleed is likely due to the use of Sudafed- stop taking it Ok to continue saline nasal spray eRx for amoxil BID x 10 days  RTC as needed or if symptoms persist or worsen

## 2013-10-31 NOTE — Patient Instructions (Signed)

## 2013-10-31 NOTE — Progress Notes (Signed)
Pre-visit discussion using our clinic review tool. No additional management support is needed unless otherwise documented below in the visit note.  

## 2014-04-02 ENCOUNTER — Ambulatory Visit (INDEPENDENT_AMBULATORY_CARE_PROVIDER_SITE_OTHER): Payer: No Typology Code available for payment source | Admitting: Family Medicine

## 2014-04-02 ENCOUNTER — Encounter: Payer: Self-pay | Admitting: Family Medicine

## 2014-04-02 VITALS — BP 118/74 | HR 77 | Temp 98.2°F | Ht 61.75 in | Wt 228.5 lb

## 2014-04-02 DIAGNOSIS — R5383 Other fatigue: Secondary | ICD-10-CM

## 2014-04-02 DIAGNOSIS — D509 Iron deficiency anemia, unspecified: Secondary | ICD-10-CM

## 2014-04-02 DIAGNOSIS — R0789 Other chest pain: Secondary | ICD-10-CM

## 2014-04-02 DIAGNOSIS — E785 Hyperlipidemia, unspecified: Secondary | ICD-10-CM

## 2014-04-02 DIAGNOSIS — R5381 Other malaise: Secondary | ICD-10-CM

## 2014-04-02 DIAGNOSIS — E039 Hypothyroidism, unspecified: Secondary | ICD-10-CM

## 2014-04-02 LAB — CBC
HEMATOCRIT: 40.8 % (ref 36.0–46.0)
HEMOGLOBIN: 13.8 g/dL (ref 12.0–15.0)
MCHC: 34 g/dL (ref 30.0–36.0)
MCV: 91.3 fl (ref 78.0–100.0)
Platelets: 244 10*3/uL (ref 150.0–400.0)
RBC: 4.46 Mil/uL (ref 3.87–5.11)
RDW: 12.9 % (ref 11.5–15.5)
WBC: 7.3 10*3/uL (ref 4.0–10.5)

## 2014-04-02 LAB — VITAMIN B12: Vitamin B-12: 510 pg/mL (ref 211–911)

## 2014-04-02 LAB — LIPID PANEL
CHOLESTEROL: 173 mg/dL (ref 0–200)
HDL: 36.8 mg/dL — ABNORMAL LOW (ref >39.00)
LDL Cholesterol: 69 mg/dL (ref 0–99)
NONHDL: 136.2
Total CHOL/HDL Ratio: 5
Triglycerides: 337 mg/dL — ABNORMAL HIGH (ref 0.0–149.0)
VLDL: 67.4 mg/dL — AB (ref 0.0–40.0)

## 2014-04-02 LAB — T4, FREE: FREE T4: 0.83 ng/dL (ref 0.60–1.60)

## 2014-04-02 LAB — TSH: TSH: 3.3 u[IU]/mL (ref 0.35–4.50)

## 2014-04-02 MED ORDER — NORGESTIMATE-ETH ESTRADIOL 0.25-35 MG-MCG PO TABS: 1.0000 | ORAL_TABLET | Freq: Every day | ORAL | Status: DC

## 2014-04-02 NOTE — Assessment & Plan Note (Signed)
Resolved after an hour weeks ago. No further episodes. Possibly gas pain. Did resolve with antacid.  Call or return to clinic prn if these symptoms worsen or fail to improve as anticipated. The patient indicates understanding of these issues and agrees with the plan.

## 2014-04-02 NOTE — Progress Notes (Signed)
   Subjective:   Patient ID: Kimberly Garner, female    DOB: 1986-09-18, 28 y.o.   MRN: [id number]  Kimberly Garner is a pleasant 28 y.o. year old female who presents to clinic today with Follow-up  on 04/02/2014  HPI:  Hypothyroidism - on Synthroid 50 mcg daily.  Lab Results  Component Value Date   TSH 3.39 07/03/2013    Denies any palpitations, changes in her hair bowels.  Did have CP one day weeks ago but it resolved.  Menorrhagia- h/o iron deficiency anemia (required blood transfusion in 2009).  Had Nexplanon implant (since May 2014)- recently had it removed (last month) due to "random bleeding"and other bad side effects.  Now on OCPs- periods are now light so far- first period on 5/27.  Has been very tired lately.  No CP or SOB. Lab Results  Component Value Date   WBC 7.8 07/03/2013   HGB 13.5 07/03/2013   HCT 39.8 07/03/2013   MCV 91.2 07/03/2013   PLT 223.0 07/03/2013   Current Outpatient Prescriptions on File Prior to Visit  Medication Sig Dispense Refill  . levothyroxine (SYNTHROID, LEVOTHROID) 50 MCG tablet TAKE 1 TABLET ( TOTAL) BY MOUTH DAILY  90 tablet  0  . Prenatal Vit-DSS-Fe Cbn-FA (PRENATAL MULTIVITAMIN-ULTRA PO) Take 1 tablet by mouth daily.         No current facility-administered medications on file prior to visit.    No Known Allergies  Past Medical History  Diagnosis Date  . Thyroid disease   . Anemia     iron deficiency  . Breast lump 2014    Past Surgical History  Procedure Laterality Date  . Cesarean section  2014  . Fetal blood transfusion  2010  . Cholecystectomy  2012  . Foot surgery  2013    Family History  Problem Relation Age of Onset  . Hypertension Mother   . Diabetes Mother     History   Social History  . Marital Status: Married    Spouse Name: N/A    Number of Children: N/A  . Years of Education: N/A   Occupational History  . Not on file.   Social History Main Topics  . Smoking status: Never Smoker   . Smokeless tobacco:  Never Used  . Alcohol Use: Yes  . Drug Use: No  . Sexual Activity: Not on file   Other Topics Concern  . Not on file   Social History Narrative   Works in Location manager at [employer]   Married   The PMH, PSH, Social History, Family History, Medications, and allergies have been reviewed in [facility], and have been updated if relevant.   Review of Systems    + fatigue Sleeping ok Denies anxiety or depression  Objective:    BP 118/74  Pulse 77  Temp(Src) 98.2 F (36.8 C) (Oral)  Ht 5' 1.75" (1.568 m)  Wt 228 lb 8 oz (103.647 kg)  BMI 42.16 kg/m2  SpO2 98%  LMP 03/25/2014   Physical Exam        Assessment & Plan:   ANEMIA-IRON DEFICIENCY - Plan: CBC  HYPERLIPIDEMIA - Plan: Lipid panel  Unspecified hypothyroidism - Plan: TSH, T4, Free  FATIGUE - Plan: Vitamin B12 No Follow-up on file.

## 2014-04-02 NOTE — Progress Notes (Signed)
Pre visit review using our clinic review tool, if applicable. No additional management support is needed unless otherwise documented below in the visit note. 

## 2014-04-02 NOTE — Assessment & Plan Note (Signed)
Continue synthroid 50 mcg daily. Check thyroid panel today. The patient indicates understanding of these issues and agrees with the plan.

## 2014-04-02 NOTE — Assessment & Plan Note (Signed)
Bleeding has slowed down. Will check CBC today. She is taking PNV.

## 2014-04-02 NOTE — Assessment & Plan Note (Signed)
Likely due to lifestyle stressors- 58 month old son, working. Will check labs today including vit B12.

## 2014-04-02 NOTE — Patient Instructions (Signed)
I will call you with your lab results. It was great to see you.  If your chest pain return, call me as soon as possible- go to ER if over the weekend.

## 2014-04-03 ENCOUNTER — Encounter: Payer: Self-pay | Admitting: Family Medicine

## 2014-07-15 ENCOUNTER — Ambulatory Visit (INDEPENDENT_AMBULATORY_CARE_PROVIDER_SITE_OTHER): Payer: No Typology Code available for payment source | Admitting: Family Medicine

## 2014-07-15 ENCOUNTER — Encounter: Payer: Self-pay | Admitting: Family Medicine

## 2014-07-15 ENCOUNTER — Other Ambulatory Visit (HOSPITAL_COMMUNITY)
Admission: RE | Admit: 2014-07-15 | Discharge: 2014-07-15 | Disposition: A | Discharge status: Home / Self Care | Payer: No Typology Code available for payment source | Source: Ambulatory Visit | Attending: Family Medicine | Admitting: Family Medicine

## 2014-07-15 VITALS — BP 118/76 | HR 78 | Temp 98.0°F | Ht 61.25 in | Wt 225.5 lb

## 2014-07-15 DIAGNOSIS — E039 Hypothyroidism, unspecified: Secondary | ICD-10-CM

## 2014-07-15 DIAGNOSIS — Z01419 Encounter for gynecological examination (general) (routine) without abnormal findings: Secondary | ICD-10-CM | POA: Insufficient documentation

## 2014-07-15 DIAGNOSIS — Z23 Encounter for immunization: Secondary | ICD-10-CM

## 2014-07-15 DIAGNOSIS — Z862 Personal history of diseases of the blood and blood-forming organs and certain disorders involving the immune mechanism: Secondary | ICD-10-CM

## 2014-07-15 DIAGNOSIS — Z113 Encounter for screening for infections with a predominantly sexual mode of transmission: Secondary | ICD-10-CM | POA: Diagnosis present

## 2014-07-15 DIAGNOSIS — Z0000 Encounter for general adult medical examination without abnormal findings: Secondary | ICD-10-CM

## 2014-07-15 DIAGNOSIS — E785 Hyperlipidemia, unspecified: Secondary | ICD-10-CM

## 2014-07-15 DIAGNOSIS — N760 Acute vaginitis: Secondary | ICD-10-CM | POA: Diagnosis present

## 2014-07-15 DIAGNOSIS — Z1151 Encounter for screening for human papillomavirus (HPV): Secondary | ICD-10-CM | POA: Insufficient documentation

## 2014-07-15 DIAGNOSIS — L732 Hidradenitis suppurativa: Secondary | ICD-10-CM | POA: Insufficient documentation

## 2014-07-15 MED ORDER — LEVOTHYROXINE SODIUM 50 MCG PO TABS: ORAL_TABLET | ORAL | Status: DC

## 2014-07-15 MED ORDER — NORGESTIMATE-ETH ESTRADIOL 0.25-35 MG-MCG PO TABS: 1.0000 | ORAL_TABLET | Freq: Every day | ORAL | Status: DC

## 2014-07-15 NOTE — Assessment & Plan Note (Signed)
Reviewed preventive care protocols, scheduled due services, and updated immunizations Discussed nutrition, exercise, diet, and healthy lifestyle.  

## 2014-07-15 NOTE — Assessment & Plan Note (Signed)
New- discussed supportive care. She will let me know if they become erythematous, painful or draining. The patient indicates understanding of these issues and agrees with the plan.

## 2014-07-15 NOTE — Patient Instructions (Signed)
Great to see you. We will call you with your results from today. 

## 2014-07-15 NOTE — Assessment & Plan Note (Signed)
Resolved now on OCPs.

## 2014-07-15 NOTE — Progress Notes (Signed)
28 yo here for CPX.  G75P14- has 30 month old son.   No h/o abnormal pap smears.  Last pap smear (done by me), was normal in 02/2012.  Had one while pregnant as well by OBYGN. Sexually active with husband only.   Hypothyroidism - on Synthroid 50 mcg daily. Lab Results  Component Value Date   TSH 3.30 04/02/2014  Denies any palpitations, changes in her hair bowels. No CP or SOB.    Menorrhagia- h/o iron deficiency anemia (required blood transfusion in 2009). Has Nexplanon implant (since May 2014).  She is having some bleeding issues- OBGYN is managing this with OCPs. She is taking a PNV. Lab Results  Component Value Date   WBC 7.3 04/02/2014   HGB 13.8 04/02/2014   HCT 40.8 04/02/2014   MCV 91.3 04/02/2014   PLT 244.0 04/02/2014   Lab Results  Component Value Date   CHOL 173 04/02/2014   HDL 36.80* 04/02/2014   LDLCALC 69 04/02/2014   LDLDIRECT 68.4 07/28/2010   TRIG 337.0* 04/02/2014   CHOLHDL 5 04/02/2014   Recurrent boils in armpits and groin- seems like it is getting worse.  Sometimes drains but usually just resolves on its own.  Patient Active Problem List   Diagnosis Date Noted  . Routine general medical examination at a health care facility 07/15/2014  . Encounter for routine gynecological examination 07/15/2014  . CHOLELITHIASIS 10/10/2010  . HYPERTRIGLYCERIDEMIA 09/08/2010  . HYPERLIPIDEMIA 07/28/2010  . Unspecified hypothyroidism 04/25/2010  . ANEMIA-IRON DEFICIENCY 04/25/2010  . FATIGUE 04/25/2010  . ELEVATED BP READING WITHOUT DX HYPERTENSION 04/25/2010  . IRON DEFICIENCY ANEMIA, HX OF 04/25/2010   Past Medical History  Diagnosis Date  . Thyroid disease   . Anemia     iron deficiency  . Breast lump 2014   Past Surgical History  Procedure Laterality Date  . Cesarean section  2014  . Fetal blood transfusion  2010  . Cholecystectomy  2012  . Foot surgery  2013   History  Substance Use Topics  . Smoking status: Never Smoker   . Smokeless tobacco: Never Used  . Alcohol  Use: Yes   Family History  Problem Relation Age of Onset  . Hypertension Mother   . Diabetes Mother    No Known Allergies Current Outpatient Prescriptions on File Prior to Visit  Medication Sig Dispense Refill  . Prenatal Vit-DSS-Fe Cbn-FA (PRENATAL MULTIVITAMIN-ULTRA PO) Take 1 tablet by mouth daily.         No current facility-administered medications on file prior to visit.    The PMH, PSH, Social History, Family History, Medications, and allergies have been reviewed in [facility], and have been updated if relevant.   Review of Systems       See HPI Patient reports no  vision/ hearing changes,anorexia, weight change, fever ,adenopathy, persistant / recurrent hoarseness, swallowing issues, chest pain, edema,persistant / recurrent cough, hemoptysis, dyspnea(rest, exertional, paroxysmal nocturnal), gastrointestinal  bleeding (melena, rectal bleeding), abdominal pain, excessive heart burn, GU symptoms(dysuria, hematuria, pyuria, voiding/incontinence  Issues) syncope, focal weakness, severe memory loss, concerning skin lesions, depression, anxiety, abnormal bruising/bleeding, major joint swelling, breast masses or abnormal vaginal bleeding.     Physical Exam BP 118/76  Pulse 78  Temp(Src) 98 F (36.7 C) (Oral)  Ht 5' 1.25" (1.556 m)  Wt 225 lb 8 oz (102.286 kg)  BMI 42.25 kg/m2  SpO2 97%  LMP 06/24/2014  Wt Readings from Last 3 Encounters:  07/15/14 225 lb 8 oz (102.286 kg)  04/02/14 228  lb 8 oz (103.647 kg)  10/31/13 235 lb (106.595 kg)     General:  Well-developed,well-nourished,in no acute distress; alert,appropriate and cooperative throughout examination Head:  normocephalic and atraumatic.   Eyes:  vision grossly intact, pupils equal, pupils round, and pupils reactive to light.   Ears:  R ear normal and L ear normal.   Nose:  no external deformity.   Mouth:  good dentition.   Neck:  No deformities, masses, or tenderness noted. Breasts:  No mass, nodules, thickening,  tenderness, bulging, retraction, inflamation, nipple discharge or skin changes noted.   Lungs:  Normal respiratory effort, chest expands symmetrically. Lungs are clear to auscultation, no crackles or wheezes. Heart:  Normal rate and regular rhythm. S1 and S2 normal without gallop, murmur, click, rub or other extra sounds. Abdomen:  Bowel sounds positive,abdomen soft and non-tender without masses, organomegaly or hernias noted. Rectal:  no external abnormalities.   Genitalia:  Pelvic Exam:        External: normal female genitalia without lesions or masses        Vagina: normal without lesions or masses        Cervix: normal without lesions or masses        Adnexa: normal bimanual exam without masses or fullness        Uterus: normal by palpation        Pap smear: performed Msk:  No deformity or scoliosis noted of thoracic or lumbar spine.   Extremities:  No clubbing, cyanosis, edema, or deformity noted with normal full range of motion of all joints.   Neurologic:  alert & oriented X3 and gait normal.   Skin:   Healing small firm papulues bilateral groin Cervical Nodes:  No lymphadenopathy noted Axillary Nodes:  No palpable lymphadenopathy Psych:  Cognition and judgment appear intact. Alert and cooperative with normal attention span and concentration. No apparent delusions, illusions, hallucinations

## 2014-07-15 NOTE — Progress Notes (Signed)
Pre visit review using our clinic review tool, if applicable. No additional management support is needed unless otherwise documented below in the visit note. 

## 2014-07-15 NOTE — Assessment & Plan Note (Addendum)
Well controlled on current rx. No changes made. 

## 2014-07-15 NOTE — Assessment & Plan Note (Signed)
Discussed USPSTF recommendations of cervical cancer screening.  She is aware that interval of 3 years is recommended but pt would prefer to have pap smear done today.  

## 2014-07-16 LAB — HIV ANTIBODY (ROUTINE TESTING W REFLEX): HIV: NONREACTIVE

## 2014-07-16 LAB — RPR

## 2014-07-17 LAB — CERVICOVAGINAL ANCILLARY ONLY
BACTERIAL VAGINITIS: POSITIVE — AB
Candida vaginitis: NEGATIVE

## 2014-07-20 ENCOUNTER — Encounter: Payer: Self-pay | Admitting: Family Medicine

## 2014-07-20 LAB — CYTOLOGY - PAP

## 2014-07-20 MED ORDER — METRONIDAZOLE 500 MG PO TABS: 500.0000 mg | ORAL_TABLET | Freq: Two times a day (BID) | ORAL | Status: DC

## 2014-07-20 NOTE — Addendum Note (Signed)
Addended by: Desmond Dike on: 07/20/2014 11:40 AM   Modules accepted: Orders

## 2014-07-21 LAB — CERVICOVAGINAL ANCILLARY ONLY: Herpes: NEGATIVE

## 2014-08-31 ENCOUNTER — Encounter: Payer: Self-pay | Admitting: Family Medicine

## 2014-11-02 ENCOUNTER — Telehealth: Payer: Self-pay

## 2014-11-02 NOTE — Telephone Encounter (Signed)
Pt left v/m requesting thyroid test to be ordered; pt thinks having some med issues; pt last seen 07/15/14 for annual exam and last TSH 04/02/2014. Left v/m for pt to cb to get additional info about symptoms and dosage of thyroid med pt presently taking.

## 2014-11-04 ENCOUNTER — Encounter: Payer: Self-pay | Admitting: Family Medicine

## 2014-12-16 ENCOUNTER — Other Ambulatory Visit: Payer: Self-pay | Admitting: Internal Medicine

## 2014-12-16 DIAGNOSIS — E039 Hypothyroidism, unspecified: Secondary | ICD-10-CM

## 2014-12-17 ENCOUNTER — Other Ambulatory Visit (INDEPENDENT_AMBULATORY_CARE_PROVIDER_SITE_OTHER): Payer: No Typology Code available for payment source

## 2014-12-17 DIAGNOSIS — E039 Hypothyroidism, unspecified: Secondary | ICD-10-CM

## 2014-12-17 LAB — TSH: TSH: 6.33 u[IU]/mL — AB (ref 0.35–4.50)

## 2014-12-17 LAB — T4, FREE: FREE T4: 0.72 ng/dL (ref 0.60–1.60)

## 2014-12-18 ENCOUNTER — Encounter: Payer: Self-pay | Admitting: Family Medicine

## 2014-12-18 NOTE — Telephone Encounter (Signed)
Pt sent email to Dr Dayton MartesAron addressing this issue.

## 2014-12-21 ENCOUNTER — Other Ambulatory Visit: Payer: Self-pay | Admitting: Family Medicine

## 2014-12-21 MED ORDER — LEVOTHYROXINE SODIUM 50 MCG PO TABS: ORAL_TABLET | ORAL | Status: DC

## 2015-02-19 NOTE — Op Note (Signed)
PATIENT NAME:  Kimberly Garner, Kimberly Garner MR#:  [id number] DATE OF BIRTH:  November 21, 1985  DATE OF PROCEDURE:  01/21/2013  PREOPERATIVE DIAGNOSIS:  Term pregnancy; failure to progress; arrest of dilatation, 9 cm.   POSTOPERATIVE DIAGNOSIS: Term pregnancy; failure to progress; arrest of dilatation,  9 cm.   PROCEDURE PERFORMED: Low transverse cesarean section.   SURGEON: Haydn Hutsell.   ASSISTANT: Jean RosenthalJackson.   ANESTHESIA: Spinal.   BLOOD LOSS: 500 mL.   COMPLICATIONS: None.   FINDINGS: Normal tubes, ovaries, and uterus. Viable female infant weighing 8 pounds 15 ounces, with Apgar scores of 8 and 8 at 1 and 5 minutes, respectively.   DISPOSITION: Recovery room, stable.   TECHNIQUE: The patient was prepped and draped in the usual sterile fashion after adequate anesthesia was obtained in the supine position on the operating room table. A scalpel was used to create a low transverse skin incision down to the level of the rectus fascia. The rectus fascia was dissected bilaterally using Mayo scissors. The rectus muscles were then separated from the rectus fascia and then separated in the midline. The peritoneum was dissected and tented and retracted inferiorly. The bladder was dissected inferiorly with the bladder blade.   A low transverse hysterotomy incision was created with a scalpel and extended by blunt dissection. Amniotomy reveals an abundant amount of fluid due to amnioinfusion, with some meconium tinting of the fluid. The infant's head was grasped and delivered without the use of a vacuum device. The oropharynx was suctioned. This baby was vigorously crying. There was no nuchal cord. The remaining infant was delivered and handed to the pediatric team after the umbilical cord was clamped and cut.   Cord blood was obtained and the placenta was manually extracted. The uterus was externalized and cleansed of all membranes and debris using a moist sponge. The hysterotomy incision was closed with a running 1 Vicryl  suture in a locking fashion, followed by a second layer to imbricate the first layer, with excellent hemostasis noted. The uterus was placed back in the intraabdominal cavity and the paracolic gutters were irrigated with warm saline. Re-examination of the incision revealed excellent hemostasis, and Interceed was placed over the incision to prevent adhesions.   The peritoneum was closed with a Vicryl suture. The rectus fascia was tented anteriorly, and  2 trocars were placed through the skin into the subfascial space for placement of the Silver Soaker catheters of the On-Q pain pump system. Catheters were threaded into place, and the fascia was then closed with a 0 Maxon suture. Subcutaneous tissues were irrigated and hemostasis was assured using electrocautery. The subcutaneous layer was closed with a plain gut suture and the skin with subcuticular closure of 4-0 Vicryl suture, followed by Dermabond. The catheters were flushed with 5 mL each of bupivacaine and then secured in place with Steri-Strips and a Tegaderm bandage. The patient went to the recovery room in stable condition. All sponge, instrument, and needle counts were correct.    ____________________________ R. Annamarie MajorPaul Darreld Hoffer, MD rph:dm D: 01/21/2013 11:10:00 ET T: 01/21/2013 11:50:46 ET JOB#: [id number]  cc: Kimberly Garner. Kimberly Nazire Fruth, MD, <Dictator> Kimberly MustardOBERT P Sarrinah Gardin MD ELECTRONICALLY SIGNED 01/21/2013 14:07

## 2015-02-24 ENCOUNTER — Other Ambulatory Visit (INDEPENDENT_AMBULATORY_CARE_PROVIDER_SITE_OTHER): Payer: No Typology Code available for payment source

## 2015-02-24 ENCOUNTER — Encounter: Payer: Self-pay | Admitting: *Deleted

## 2015-02-24 DIAGNOSIS — E039 Hypothyroidism, unspecified: Secondary | ICD-10-CM

## 2015-02-24 LAB — TSH: TSH: 3.87 u[IU]/mL (ref 0.35–4.50)

## 2015-03-09 NOTE — H&P (Signed)
L&D Evaluation:  History:   HPI 29yo G1 at 239w2d presents with c/o contractions increasing in frequency and intensity over last 24hours, now 3 ctx/1 hour.  Also with decreased FM.  [facility]  PNL: A - (s/p rhogam 10/29/12), VI , RI, GBS unk.    Presents with contractions    Patient's Medical History hypothyroidism    Patient's Surgical History Colecystectomy  bunionectomy, WTE    Medications Pre Natal Vitamins  synthroid    Allergies NKDA    Social History none    Family History Non-Contributory   ROS:   ROS All systems were reviewed.  HEENT, CNS, GI, GU, Respiratory, CV, Renal and Musculoskeletal systems were found to be normal.   Exam:   Vital Signs elevated BP 154/61, following BPs all normal 90-110/50-60    General no apparent distress    Mental Status clear    Chest normal effort    Heart normal sinus rhythm    Abdomen gravid, mild RLQ tenderness    Estimated Fetal Weight Average for gestational age    Back no CVAT    Edema no edema    FHT normal rate with no decels, reactive NST    Fetal Heart Rate 135    Ucx absent    Skin dry   Impression:   Impression G1 at 299w2d not in preterm labor.   Plan:   Comments discharge home    Follow Up Appointment already scheduled   Electronic Signatures: Orvan FalconerStansbury Clipp, Allona Gondek K (MD)  (Signed 07-Feb-14 16:26)  Authored: L&D Evaluation   Last Updated: 07-Feb-14 16:26 by Garnette GunnerStansbury Clipp, Ali LoweEryn K (MD)

## 2015-04-16 ENCOUNTER — Ambulatory Visit (INDEPENDENT_AMBULATORY_CARE_PROVIDER_SITE_OTHER): Payer: No Typology Code available for payment source | Admitting: Family Medicine

## 2015-04-16 ENCOUNTER — Encounter: Payer: Self-pay | Admitting: Family Medicine

## 2015-04-16 VITALS — BP 116/64 | HR 81 | Temp 97.7°F | Wt 224.5 lb

## 2015-04-16 DIAGNOSIS — J0100 Acute maxillary sinusitis, unspecified: Secondary | ICD-10-CM

## 2015-04-16 MED ORDER — AMOXICILLIN 500 MG PO CAPS: 1000.0000 mg | ORAL_CAPSULE | Freq: Two times a day (BID) | ORAL | Status: DC

## 2015-04-16 NOTE — Progress Notes (Signed)
Subjective:     Patient ID: Kimberly Garner, female   DOB: 18-Aug-1986, 29 y.o.   MRN: [id number]  HPI Ms. Hinebaugh is a 29 y/o F presenting with congestion and bilateral ear pain and sinus pressure for 2 weeks. Tried flonase, vicks sinus, saline rinses with neti pots, humidifier, with minimal relief of sx. Symptoms are getting worse since it started 2 weeks ago. Endorses eye discharge and "raw" throat. No fevers. 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   Social History /Family History/Past Medical History reviewed and updated if needed. Has had sinus infections before, last in 2015.   Review of Systems  Constitutional: Positive for fatigue. Negative for fever.  HENT: Positive for congestion, ear pain, postnasal drip, sinus pressure and sore throat.   Eyes: Positive for discharge.  Respiratory: Negative for cough and shortness of breath.   Cardiovascular: Negative for chest pain.  Neurological: Positive for headaches.       Objective:   Physical Exam  Constitutional: She is oriented to person, place, and time. She appears well-developed and well-nourished.  HENT:  Head: Normocephalic and atraumatic.  Right Ear: Tympanic membrane normal.  Left Ear: Tympanic membrane normal.  Nose: No mucosal edema. Right sinus exhibits maxillary sinus tenderness. Left sinus exhibits maxillary sinus tenderness.  Mouth/Throat: Mucous membranes are normal. Posterior oropharyngeal erythema present. No oropharyngeal exudate or posterior oropharyngeal edema.  Cardiovascular: Normal rate and normal heart sounds.  Exam reveals no gallop and no friction rub.   No murmur heard. Pulmonary/Chest: Effort normal and breath sounds normal. No respiratory distress. She has no wheezes.  Neurological: She is alert and oriented to person, place, and time.  Psychiatric: She has a normal mood and affect. Her behavior is normal.       Assessment:     1. Acute sinusitis: Most likely due to 2 week duration  of worsening upper respiratory sx and maxillary sinus tenderness on exam. Afebrile, lungs clear.    Plan:     1. Acute sinusitis: Treat with amoxicillin 500 mg BID for 10 days. Return if sx worsen or fail to improve as expected. Can continue using humidifier, nasal saline for symptom relief.      Ruthe Mannan served as Neurosurgeon for Dr. Ermalene Searing 04/16/15 9:15 am  Patient seen and examined. Med student acted as Neurosurgeon only.  HPI/ROS/PE and assessment/plan created  By MD and scribed by med student.  Kerby Nora MD

## 2015-04-16 NOTE — Progress Notes (Signed)
Pre visit review using our clinic review tool, if applicable. No additional management support is needed unless otherwise documented below in the visit note. 

## 2015-04-16 NOTE — Patient Instructions (Addendum)
Start taking the antibiotic Call if sx worsen or fail to improve as expected Can continue using humidifier, nasal saline, Mucinex for symptom relief

## 2015-06-04 ENCOUNTER — Other Ambulatory Visit: Payer: Self-pay | Admitting: Family Medicine

## 2015-06-07 ENCOUNTER — Other Ambulatory Visit: Payer: Self-pay | Admitting: Family Medicine

## 2015-07-04 ENCOUNTER — Other Ambulatory Visit: Payer: Self-pay | Admitting: Family Medicine

## 2015-07-28 ENCOUNTER — Other Ambulatory Visit: Payer: Self-pay | Admitting: Family Medicine

## 2015-07-28 NOTE — Telephone Encounter (Signed)
Last office visit 04/16/2015 with Dr. Ermalene Searing for sinusitis.  Last refill states she needs a CPE prior to any additional refills.  No future appointment scheduled.  Refill?

## 2015-07-29 NOTE — Telephone Encounter (Signed)
Ok to refill.  Needs CPE for further refills.

## 2015-07-29 NOTE — Telephone Encounter (Signed)
Please call patient and schedule CPE as instructed. 

## 2015-08-25 ENCOUNTER — Other Ambulatory Visit: Payer: Self-pay | Admitting: Family Medicine

## 2015-08-25 NOTE — Telephone Encounter (Signed)
Lm on pts vm informing her CPE required for additional refills. #[phone] sent to pharmacy

## 2015-09-15 ENCOUNTER — Other Ambulatory Visit: Payer: Self-pay | Admitting: Family Medicine

## 2015-09-15 ENCOUNTER — Encounter: Payer: No Typology Code available for payment source | Admitting: Family Medicine

## 2015-09-16 ENCOUNTER — Encounter: Payer: No Typology Code available for payment source | Admitting: Family Medicine

## 2015-09-29 ENCOUNTER — Ambulatory Visit (INDEPENDENT_AMBULATORY_CARE_PROVIDER_SITE_OTHER): Payer: Managed Care, Other (non HMO) | Admitting: Family Medicine

## 2015-09-29 ENCOUNTER — Encounter: Payer: Self-pay | Admitting: Family Medicine

## 2015-09-29 VITALS — BP 120/70 | HR 73 | Temp 98.4°F | Ht 61.0 in | Wt 197.0 lb

## 2015-09-29 DIAGNOSIS — E039 Hypothyroidism, unspecified: Secondary | ICD-10-CM | POA: Diagnosis not present

## 2015-09-29 DIAGNOSIS — Z0000 Encounter for general adult medical examination without abnormal findings: Secondary | ICD-10-CM | POA: Diagnosis not present

## 2015-09-29 DIAGNOSIS — E785 Hyperlipidemia, unspecified: Secondary | ICD-10-CM

## 2015-09-29 LAB — COMPREHENSIVE METABOLIC PANEL
ALBUMIN: 3.9 g/dL (ref 3.5–5.2)
ALT: 15 U/L (ref 0–35)
AST: 16 U/L (ref 0–37)
Alkaline Phosphatase: 63 U/L (ref 39–117)
BILIRUBIN TOTAL: 0.4 mg/dL (ref 0.2–1.2)
BUN: 14 mg/dL (ref 6–23)
CALCIUM: 9.2 mg/dL (ref 8.4–10.5)
CO2: 28 meq/L (ref 19–32)
CREATININE: 0.75 mg/dL (ref 0.40–1.20)
Chloride: 104 mEq/L (ref 96–112)
GFR: 96.72 mL/min (ref >60.00)
Glucose, Bld: 81 mg/dL (ref 70–99)
Potassium: 4.2 mEq/L (ref 3.5–5.1)
Sodium: 139 mEq/L (ref 135–145)
Total Protein: 6.9 g/dL (ref 6.0–8.3)

## 2015-09-29 LAB — CBC WITH DIFFERENTIAL/PLATELET
BASOS PCT: 0.4 % (ref 0.0–3.0)
Basophils Absolute: 0 10*3/uL (ref 0.0–0.1)
EOS ABS: 0 10*3/uL (ref 0.0–0.7)
Eosinophils Relative: 0.8 % (ref 0.0–5.0)
HCT: 40.8 % (ref 36.0–46.0)
Hemoglobin: 13.9 g/dL (ref 12.0–15.0)
Lymphocytes Relative: 29.2 % (ref 12.0–46.0)
Lymphs Abs: 1.7 10*3/uL (ref 0.7–4.0)
MCHC: 34.1 g/dL (ref 30.0–36.0)
MCV: 91.7 fl (ref 78.0–100.0)
MONO ABS: 0.3 10*3/uL (ref 0.1–1.0)
Monocytes Relative: 5.6 % (ref 3.0–12.0)
NEUTROS ABS: 3.7 10*3/uL (ref 1.4–7.7)
Neutrophils Relative %: 64 % (ref 43.0–77.0)
PLATELETS: 215 10*3/uL (ref 150.0–400.0)
RBC: 4.46 Mil/uL (ref 3.87–5.11)
RDW: 12.4 % (ref 11.5–15.5)
WBC: 5.8 10*3/uL (ref 4.0–10.5)

## 2015-09-29 LAB — LIPID PANEL
CHOL/HDL RATIO: 4
CHOLESTEROL: 152 mg/dL (ref 0–200)
HDL: 42.4 mg/dL (ref >39.00)
LDL Cholesterol: 75 mg/dL (ref 0–99)
NonHDL: 109.57
TRIGLYCERIDES: 171 mg/dL — AB (ref 0.0–149.0)
VLDL: 34.2 mg/dL (ref 0.0–40.0)

## 2015-09-29 LAB — TSH: TSH: 3.27 u[IU]/mL (ref 0.35–4.50)

## 2015-09-29 LAB — T4, FREE: Free T4: 0.64 ng/dL (ref 0.60–1.60)

## 2015-09-29 LAB — VITAMIN B12: VITAMIN B 12: 395 pg/mL (ref 211–911)

## 2015-09-29 LAB — VITAMIN D 25 HYDROXY (VIT D DEFICIENCY, FRACTURES): VITD: 27.47 ng/mL — ABNORMAL LOW (ref 30.00–100.00)

## 2015-09-29 NOTE — Assessment & Plan Note (Signed)
Reviewed preventive care protocols, scheduled due services, and updated immunizations Discussed nutrition, exercise, diet, and healthy lifestyle.  Orders Placed This Encounter  Procedures  . TSH  . T4, Free  . CBC with Differential/Platelet  . Lipid panel  . Comprehensive metabolic panel  . Vitamin D, 25-hydroxy  . Vitamin B12

## 2015-09-29 NOTE — Progress Notes (Signed)
Pre visit review using our clinic review tool, if applicable. No additional management support is needed unless otherwise documented below in the visit note. 

## 2015-09-29 NOTE — Progress Notes (Signed)
29 yo pleasant female here for CPX.    [id number]- has 475 year old son old son.   No h/o abnormal pap smears.  Last pap smear (done by me), was normal in 9/16/1  Hypothyroidism - on Synthroid 50 mcg daily. Lab Results  Component Value Date   TSH 3.87 02/24/2015   No CP or SOB.  She has had some more fatigue lately. She wonders if the pharmacy is using a different manufacturer for her synthroid.  Menorrhagia- h/o iron deficiency anemia (required blood transfusion in 2009). Has Nexplanon implant (since May 2014).  She is having some bleeding issues- OBGYN is managing this with OCPs. She is taking a PNV. Lab Results  Component Value Date   WBC 7.3 04/02/2014   HGB 13.8 04/02/2014   HCT 40.8 04/02/2014   MCV 91.3 04/02/2014   PLT 244.0 04/02/2014   Lab Results  Component Value Date   CHOL 173 04/02/2014   HDL 36.80* 04/02/2014   LDLCALC 69 04/02/2014   LDLDIRECT 68.4 07/28/2010   TRIG 337.0* 04/02/2014   CHOLHDL 5 04/02/2014     Patient Active Problem List   Diagnosis Date Noted  . Routine general medical examination at a health care facility 07/15/2014  . Encounter for routine gynecological examination 07/15/2014  . Hidradenitis 07/15/2014  . CHOLELITHIASIS 10/10/2010  . HYPERTRIGLYCERIDEMIA 09/08/2010  . HLD (hyperlipidemia) 07/28/2010  . Hypothyroidism 04/25/2010  . ANEMIA-IRON DEFICIENCY 04/25/2010  . FATIGUE 04/25/2010  . ELEVATED BP READING WITHOUT DX HYPERTENSION 04/25/2010  . IRON DEFICIENCY ANEMIA, HX OF 04/25/2010   Past Medical History  Diagnosis Date  . Thyroid disease   . Anemia     iron deficiency  . Breast lump 2014   Past Surgical History  Procedure Laterality Date  . Cesarean section  2014  . Fetal blood transfusion  2010  . Cholecystectomy  2012  . Foot surgery  2013   Social History  Substance Use Topics  . Smoking status: Never Smoker   . Smokeless tobacco: Never Used  . Alcohol Use: Yes   Family History  Problem Relation Age of Onset  .  Hypertension Mother   . Diabetes Mother    No Known Allergies Current Outpatient Prescriptions on File Prior to Visit  Medication Sig Dispense Refill  . levothyroxine (SYNTHROID, LEVOTHROID) 50 MCG tablet TAKE 1 TABLET(50 MCG) BY MOUTH DAILY 30 tablet 11  . MONONESSA 0.25-35 MG-MCG tablet TAKE 1 TABLET BY MOUTH EVERY DAY 28 tablet 0  . Prenatal Vit-DSS-Fe Cbn-FA (PRENATAL MULTIVITAMIN-ULTRA PO) Take 1 tablet by mouth daily.       No current facility-administered medications on file prior to visit.    The PMH, PSH, Social History, Family History, Medications, and allergies [facility], and have been updated if relevant.   Review of Systems  Constitutional: Positive for fatigue.  HENT: Negative.   Eyes: Negative.   Respiratory: Negative.   Cardiovascular: Negative.   Gastrointestinal: Negative.   Genitourinary: Negative.   Musculoskeletal: Negative.   Skin: Negative.   Allergic/Immunologic: Negative.   Neurological: Negative.   Hematological: Negative.   Psychiatric/Behavioral: Negative.   All other systems reviewed and are negative.    Physical Exam BP 120/70 mmHg  Pulse 73  Temp(Src) 98.4 F (36.9 C) (Tympanic)  Ht 5\' 1"  (1.549 m)  Wt 197 lb (89.359 kg)  BMI 37.24 kg/m2  SpO2 98%  LMP 09/17/2015 (Approximate)  Wt Readings from Last 3 Encounters:  09/29/15 197 lb (89.359 kg)  04/16/15  224 lb 8 oz (101.833 kg)  07/15/14 225 lb 8 oz (102.286 kg)     General:  Well-developed,well-nourished,in no acute distress; alert,appropriate and cooperative throughout examination Head:  normocephalic and atraumatic.   Eyes:  vision grossly intact, pupils equal, pupils round, and pupils reactive to light.   Ears:  R ear normal and L ear normal.   Nose:  no external deformity.   Mouth:  good dentition.   Neck:  No deformities, masses, or tenderness noted. Breasts:  No mass, nodules, thickening, tenderness, bulging, retraction, inflamation, nipple discharge or  skin changes noted.   Lungs:  Normal respiratory effort, chest expands symmetrically. Lungs are clear to auscultation, no crackles or wheezes. Heart:  Normal rate and regular rhythm. S1 and S2 normal without gallop, murmur, click, rub or other extra sounds. Abdomen:  Bowel sounds positive,abdomen soft and non-tender without masses, organomegaly or hernias noted. Msk:  No deformity or scoliosis noted of thoracic or lumbar spine.   Extremities:  No clubbing, cyanosis, edema, or deformity noted with normal full range of motion of all joints.   Neurologic:  alert & oriented X3 and gait normal.    Cervical Nodes:  No lymphadenopathy noted Axillary Nodes:  No palpable lymphadenopathy Psych:  Cognition and judgment appear intact. Alert and cooperative with normal attention span and concentration. No apparent delusions, illusions, hallucinations

## 2015-09-29 NOTE — Addendum Note (Signed)
Addended by: Baldomero LamyHAVERS, NATASHA C on: 09/29/2015 08:25 AM   Modules accepted: Kipp BroodSmartSet

## 2015-09-29 NOTE — Assessment & Plan Note (Signed)
Labs today

## 2015-09-29 NOTE — Patient Instructions (Signed)
Great to see you. Happy Holidays!  We will call you with your lab results and you can view them online. 

## 2015-09-29 NOTE — Assessment & Plan Note (Signed)
Recheck thyroid function- having more fatigue. Continue current dose of synthroid until we have lab results. The patient indicates understanding of these issues and agrees with the plan.

## 2015-10-08 ENCOUNTER — Other Ambulatory Visit: Payer: Self-pay | Admitting: Family Medicine

## 2016-02-21 ENCOUNTER — Ambulatory Visit (INDEPENDENT_AMBULATORY_CARE_PROVIDER_SITE_OTHER): Payer: Managed Care, Other (non HMO) | Admitting: Primary Care

## 2016-02-21 ENCOUNTER — Encounter: Payer: Self-pay | Admitting: Primary Care

## 2016-02-21 VITALS — BP 116/66 | HR 69 | Temp 98.5°F | Ht 61.0 in | Wt 182.0 lb

## 2016-02-21 DIAGNOSIS — J302 Other seasonal allergic rhinitis: Secondary | ICD-10-CM | POA: Diagnosis not present

## 2016-02-21 NOTE — Progress Notes (Signed)
Subjective:    Patient ID: Kimberly Garner, female    DOB: [birth year], 30 y.o.   MRN: [id number]  HPI  [name] is a 30 year old female who presents today with a chief complaint of ear pain. She also reports nasal congestion, sore throat, cough, sinus pressure. Her ear pain is located to bilateral ears. She has a history of seasonal allergies. Her symptoms began this past Saturday (2 days ago). Yesterday she noticed an increase in her symptoms. She taken Claritin, tylenol cold and sinus, Vicks sinus spray, and Flonase with temporary improvement. Denies sick contacts, fevers, nausea.   Review of Systems  Constitutional: Negative for fever and chills.  HENT: Positive for congestion, ear pain, postnasal drip, sinus pressure and sore throat.   Respiratory: Positive for cough. Negative for shortness of breath.   Cardiovascular: Negative for chest pain.  Allergic/Immunologic: Positive for environmental allergies.       Past Medical History  Diagnosis Date  . Thyroid disease   . Anemia     iron deficiency  . Breast lump 2014     Social History   Social History  . Marital Status: Married    Spouse Name: N/A  . Number of Children: N/A  . Years of Education: N/A   Occupational History  . Not on file.   Social History Main Topics  . Smoking status: Never Smoker   . Smokeless tobacco: Never Used  . Alcohol Use: Yes  . Drug Use: No  . Sexual Activity: Not on file   Other Topics Concern  . 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 Cesarean section  2014  . Fetal blood transfusion  2010  . Cholecystectomy  2012  . Foot surgery  2013    Family History  Problem Relation Age of Onset  . Hypertension Mother   . Diabetes Mother     No Known Allergies  Current Outpatient Prescriptions on File Prior to Visit  Medication Sig Dispense Refill  . levothyroxine  (SYNTHROID, LEVOTHROID) 50 MCG tablet TAKE 1 TABLET(50 MCG) BY MOUTH DAILY 30 tablet 11  . MONONESSA 0.25-35 MG-MCG tablet TAKE 1 TABLET BY MOUTH EVERY DAY 28 tablet 11  . Prenatal Vit-DSS-Fe Cbn-FA (PRENATAL MULTIVITAMIN-ULTRA PO) Take 1 tablet by mouth daily.       No current facility-administered medications on file prior to visit.    BP 116/66 mmHg  Pulse 69  Temp(Src) 98.5 F (36.9 C) (Oral)  Ht  (1.549 m)  Wt 182 lb (82.555 kg)  BMI 34.41 kg/m2  SpO2 97%  LMP 02/14/2016    Objective:   Physical Exam  Constitutional: She appears well-nourished.  HENT:  Right Ear: Tympanic membrane and ear canal normal.  Left Ear: Tympanic membrane and ear canal normal.  Nose: Mucosal edema present. Right sinus exhibits no maxillary sinus tenderness and no frontal sinus tenderness. Left sinus exhibits no maxillary sinus tenderness and no frontal sinus tenderness.  Mouth/Throat: Oropharynx is clear and moist.  Eyes: Conjunctivae are normal.  Neck: Neck supple.  Cardiovascular: Normal rate and regular rhythm.   Pulmonary/Chest: Effort normal and breath sounds normal. She has no wheezes. She has no rales.  Lymphadenopathy:    She has no cervical adenopathy.  Skin: Skin is warm and dry.          Assessment & Plan:  Allergic  Rhinitis:  Nasal congestion, sinus pressure, sore throat, cough x 2 days. Some improvement with OTC treatment. Exam consistent with allergic rhinitis. Clear lungs which is reassuring. Will treat with supportive measures.  Start Claritin D for 5 days. Continue Flonase. Start Ibuprofen PRN earache and body aches.  Return precautions provided.

## 2016-02-21 NOTE — Patient Instructions (Signed)
Stop Claritin and switch to Claritin D. This is purchased behind the pharmacy counter. Take this medication for 5 days.  Continue Flonase for ear pressure and nasal congestion.  You may take ibuprofen 600 mg three times daily as needed for ear pain and body aches.  Please notify me if you develop persistent fevers of 101, start coughing up green mucous, notice increased fatigue or weakness, or feel worse after 1 week of onset of symptoms.   Increase consumption of water intake and rest.  It was a pleasure meeting you!  Allergic Rhinitis Allergic rhinitis is when the mucous membranes in the nose respond to allergens. Allergens are particles in the air that cause your body to have an allergic reaction. This causes you to release allergic antibodies. Through a chain of events, these eventually cause you to release histamine into the blood stream. Although meant to protect the body, it is this release of histamine that causes your discomfort, such as frequent sneezing, congestion, and an itchy, runny nose.  CAUSES Seasonal allergic rhinitis (hay fever) is caused by pollen allergens that may come from grasses, trees, and weeds. Year-round allergic rhinitis (perennial allergic rhinitis) is caused by allergens such as house dust mites, pet dander, and mold spores. SYMPTOMS  Nasal stuffiness (congestion).  Itchy, runny nose with sneezing and tearing of the eyes. DIAGNOSIS Your health care provider can help you determine the allergen or allergens that trigger your symptoms. If you and your health care provider are unable to determine the allergen, skin or blood testing may be used. Your health care provider will diagnose your condition after taking your health history and performing a physical exam. Your health care provider may assess you for other related conditions, such as asthma, pink eye, or an ear infection. TREATMENT Allergic rhinitis does not have a cure, but it can be controlled  by:  Medicines that block allergy symptoms. These may include allergy shots, nasal sprays, and oral antihistamines.  Avoiding the allergen. Hay fever may often be treated with antihistamines in pill or nasal spray forms. Antihistamines block the effects of histamine. There are over-the-counter medicines that may help with nasal congestion and swelling around the eyes. Check with your health care provider before taking or giving this medicine. If avoiding the allergen or the medicine prescribed do not work, there are many new medicines your health care provider can prescribe. Stronger medicine may be used if initial measures are ineffective. Desensitizing injections can be used if medicine and avoidance does not work. Desensitization is when a patient is given ongoing shots until the body becomes less sensitive to the allergen. Make sure you follow up with your health care provider if problems continue. HOME CARE INSTRUCTIONS It is not possible to completely avoid allergens, but you can reduce your symptoms by taking steps to limit your exposure to them. It helps to know exactly what you are allergic to so that you can avoid your specific triggers. SEEK MEDICAL CARE IF:  You have a fever.  You develop a cough that does not stop easily (persistent).  You have shortness of breath.  You start wheezing.  Symptoms interfere with normal daily activities.   This information is not intended to replace advice given to you by your health care provider. Make sure you discuss any questions you have with your health care provider.   Document Released: 07/11/2001 Document Revised: 11/06/2014 Document Reviewed: 06/23/2013 Elsevier Interactive Patient Education [employer]2016 Elsevier Inc.

## 2016-02-21 NOTE — Progress Notes (Signed)
Pre visit review using our clinic review tool, if applicable. No additional management support is needed unless otherwise documented below in the visit note. 

## 2016-03-06 ENCOUNTER — Other Ambulatory Visit: Payer: Self-pay | Admitting: *Deleted

## 2016-03-06 MED ORDER — LEVOTHYROXINE SODIUM 50 MCG PO TABS: ORAL_TABLET | ORAL | Status: DC

## 2016-03-06 NOTE — Telephone Encounter (Signed)
Normal TSH 09/29/2015

## 2016-08-02 ENCOUNTER — Other Ambulatory Visit: Payer: Self-pay | Admitting: Family Medicine

## 2016-08-23 ENCOUNTER — Encounter: Payer: Self-pay | Admitting: Family Medicine

## 2016-08-23 ENCOUNTER — Ambulatory Visit (INDEPENDENT_AMBULATORY_CARE_PROVIDER_SITE_OTHER): Payer: 59 | Admitting: Family Medicine

## 2016-08-23 VITALS — BP 124/68 | HR 83 | Temp 98.0°F | Wt 194.5 lb

## 2016-08-23 DIAGNOSIS — R300 Dysuria: Secondary | ICD-10-CM

## 2016-08-23 MED ORDER — CIPROFLOXACIN HCL 500 MG PO TABS: 500.0000 mg | ORAL_TABLET | Freq: Two times a day (BID) | ORAL | 0 refills | Status: DC

## 2016-08-23 NOTE — Progress Notes (Signed)
Pre visit review using our clinic review tool, if applicable. No additional management support is needed unless otherwise documented below in the visit note. 

## 2016-08-23 NOTE — Progress Notes (Signed)
SUBJECTIVE: Kimberly Garner is a 30 y.o. female who complains of urinary frequency, urgency and dysuria x 1 days, with flank pain but without fever, chills, or abnormal vaginal discharge or bleeding.   Current Outpatient Prescriptions on File Prior to Visit  Medication Sig Dispense Refill  . levothyroxine (SYNTHROID, LEVOTHROID) 50 MCG tablet TAKE 1 TABLET(50 MCG) BY MOUTH DAILY 30 tablet 6  . norgestimate-ethinyl estradiol (MONONESSA) 0.25-35 MG-MCG tablet Take 1 tablet by mouth daily. COMPLETE PHYSICAL EXAM REQUIRED FOR ADDITIONAL REFILLS 28 tablet 0  . Prenatal Vit-DSS-Fe Cbn-FA (PRENATAL MULTIVITAMIN-ULTRA PO) Take 1 tablet by mouth daily.       No current facility-administered medications on file prior to visit.     No Known Allergies  Past Medical History:  Diagnosis Date  . Anemia    iron deficiency  . Breast lump 2014  . Thyroid disease     Past Surgical History:  Procedure Laterality Date  . CESAREAN SECTION  2014  . CHOLECYSTECTOMY  2012  . FETAL BLOOD TRANSFUSION  2010  . FOOT SURGERY  2013    Family History  Problem Relation Age of Onset  . Hypertension Mother   . Diabetes Mother     Social History   Social History  . Marital status: Married    Spouse name: N/A  . Number of children: N/A  . Years of education: N/A   Occupational History  . Not on file.   Social History Main Topics  . Smoking status: Never Smoker  . Smokeless tobacco: Never Used  . Alcohol use Yes  . Drug use: No  . Sexual activity: Not on file   Other Topics Concern  . Not on file   Social History Narrative   Works in Location managerfront office at [employer]   Married   The PMH, PSH, Social History, Family History, Medications, and allergies [facility], and have been updated if relevant.   OBJECTIVE: BP 124/68   Pulse 83   Temp 98 F (36.7 C) (Oral)   Wt 194 lb 8 oz (88.2 kg)   LMP 08/03/2016   SpO2 98%   BMI 36.75 kg/m    Appears well, in no apparent  distress.  Vital signs are normal. The abdomen is soft without tenderness, guarding, mass, rebound or organomegaly. No CVA tenderness or inguinal adenopathy noted. Urine dipstick shows- unable to do because she took azo.  ASSESSMENT/PLAN: UTI - treat presumptively with cipro 500 mg twice daily x 3 days,  Send urine for cx, also push fluids, may use Pyridium OTC prn. Call or return to clinic prn if these symptoms worsen or fail to improve as anticipated.

## 2016-08-23 NOTE — Patient Instructions (Signed)

## 2016-08-25 LAB — URINE CULTURE

## 2016-08-28 ENCOUNTER — Other Ambulatory Visit: Payer: Self-pay | Admitting: Family Medicine

## 2016-09-24 ENCOUNTER — Other Ambulatory Visit: Payer: Self-pay | Admitting: Family Medicine

## 2016-09-26 ENCOUNTER — Other Ambulatory Visit: Payer: Managed Care, Other (non HMO)

## 2016-09-28 ENCOUNTER — Encounter: Payer: Managed Care, Other (non HMO) | Admitting: Family Medicine

## 2016-10-04 ENCOUNTER — Other Ambulatory Visit (HOSPITAL_COMMUNITY)
Admission: RE | Admit: 2016-10-04 | Discharge: 2016-10-04 | Disposition: A | Discharge status: Home / Self Care | Payer: 59 | Source: Ambulatory Visit | Attending: Family Medicine | Admitting: Family Medicine

## 2016-10-04 ENCOUNTER — Encounter: Payer: Self-pay | Admitting: Family Medicine

## 2016-10-04 ENCOUNTER — Ambulatory Visit (INDEPENDENT_AMBULATORY_CARE_PROVIDER_SITE_OTHER): Payer: 59 | Admitting: Family Medicine

## 2016-10-04 VITALS — BP 108/70 | HR 77 | Temp 98.2°F | Ht 61.75 in | Wt 195.0 lb

## 2016-10-04 DIAGNOSIS — E559 Vitamin D deficiency, unspecified: Secondary | ICD-10-CM | POA: Diagnosis not present

## 2016-10-04 DIAGNOSIS — Z1151 Encounter for screening for human papillomavirus (HPV): Secondary | ICD-10-CM | POA: Insufficient documentation

## 2016-10-04 DIAGNOSIS — Z0000 Encounter for general adult medical examination without abnormal findings: Secondary | ICD-10-CM

## 2016-10-04 DIAGNOSIS — Z01419 Encounter for gynecological examination (general) (routine) without abnormal findings: Secondary | ICD-10-CM | POA: Diagnosis present

## 2016-10-04 DIAGNOSIS — E785 Hyperlipidemia, unspecified: Secondary | ICD-10-CM | POA: Diagnosis not present

## 2016-10-04 DIAGNOSIS — E039 Hypothyroidism, unspecified: Secondary | ICD-10-CM

## 2016-10-04 DIAGNOSIS — Z113 Encounter for screening for infections with a predominantly sexual mode of transmission: Secondary | ICD-10-CM | POA: Insufficient documentation

## 2016-10-04 LAB — CBC WITH DIFFERENTIAL/PLATELET
BASOS PCT: 0.5 % (ref 0.0–3.0)
Basophils Absolute: 0 10*3/uL (ref 0.0–0.1)
EOS ABS: 0.1 10*3/uL (ref 0.0–0.7)
EOS PCT: 1.5 % (ref 0.0–5.0)
HCT: 38 % (ref 36.0–46.0)
Hemoglobin: 13.1 g/dL (ref 12.0–15.0)
LYMPHS ABS: 1.8 10*3/uL (ref 0.7–4.0)
Lymphocytes Relative: 26.5 % (ref 12.0–46.0)
MCHC: 34.6 g/dL (ref 30.0–36.0)
MCV: 90.7 fl (ref 78.0–100.0)
MONO ABS: 0.4 10*3/uL (ref 0.1–1.0)
Monocytes Relative: 6 % (ref 3.0–12.0)
NEUTROS PCT: 65.5 % (ref 43.0–77.0)
Neutro Abs: 4.5 10*3/uL (ref 1.4–7.7)
Platelets: 239 10*3/uL (ref 150.0–400.0)
RBC: 4.18 Mil/uL (ref 3.87–5.11)
RDW: 12.6 % (ref 11.5–15.5)
WBC: 6.9 10*3/uL (ref 4.0–10.5)

## 2016-10-04 LAB — COMPREHENSIVE METABOLIC PANEL
ALT: 21 U/L (ref 0–35)
AST: 19 U/L (ref 0–37)
Albumin: 4 g/dL (ref 3.5–5.2)
Alkaline Phosphatase: 55 U/L (ref 39–117)
BUN: 13 mg/dL (ref 6–23)
CHLORIDE: 104 meq/L (ref 96–112)
CO2: 28 mEq/L (ref 19–32)
CREATININE: 0.83 mg/dL (ref 0.40–1.20)
Calcium: 9.2 mg/dL (ref 8.4–10.5)
GFR: 85.46 mL/min (ref >60.00)
GLUCOSE: 86 mg/dL (ref 70–99)
POTASSIUM: 4.4 meq/L (ref 3.5–5.1)
SODIUM: 139 meq/L (ref 135–145)
Total Bilirubin: 0.5 mg/dL (ref 0.2–1.2)
Total Protein: 7 g/dL (ref 6.0–8.3)

## 2016-10-04 LAB — LIPID PANEL
CHOL/HDL RATIO: 3
Cholesterol: 145 mg/dL (ref 0–200)
HDL: 49.4 mg/dL (ref >39.00)
LDL CALC: 61 mg/dL (ref 0–99)
NONHDL: 95.18
Triglycerides: 173 mg/dL — ABNORMAL HIGH (ref 0.0–149.0)
VLDL: 34.6 mg/dL (ref 0.0–40.0)

## 2016-10-04 LAB — VITAMIN D 25 HYDROXY (VIT D DEFICIENCY, FRACTURES): VITD: 37.43 ng/mL (ref 30.00–100.00)

## 2016-10-04 LAB — T4, FREE: Free T4: 0.72 ng/dL (ref 0.60–1.60)

## 2016-10-04 LAB — TSH: TSH: 4.18 u[IU]/mL (ref 0.35–4.50)

## 2016-10-04 LAB — HEMOGLOBIN A1C: Hgb A1c MFr Bld: 4.9 % (ref 4.6–6.5)

## 2016-10-04 NOTE — Assessment & Plan Note (Signed)
Clinically euthyroid. Continue current dose of synthroid. Labs today.

## 2016-10-04 NOTE — Progress Notes (Signed)
Pre visit review using our clinic review tool, if applicable. No additional management support is needed unless otherwise documented below in the visit note. 

## 2016-10-04 NOTE — Progress Notes (Signed)
Subjective:   Patient ID: Kimberly LownHeather Garner, female    DOB: 10/26/1986, 30 y.o.   MRN: [id number]  Kimberly LownHeather Garner is a pleasant 30 y.o. year old female who presents to clinic today with Annual Exam and Discuss Checking for Diabetes (Aunt was just diagnosed. Grandfather is diabetic with leg amputation.)  on 10/04/2016  HPI:  30 yo G1P1 here for CPX.  No h/o abnormal pap smears.  Last pap smear done by me on 07/15/14.  Hypothyroidism- currently taking 50 mcg daily. Denies any symptoms of hypo or hyperhthyroidism.  Lab Results  Component Value Date   CHOL 152 09/29/2015   HDL 42.40 09/29/2015   LDLCALC 75 09/29/2015   LDLDIRECT 68.4 07/28/2010   TRIG 171.0 (H) 09/29/2015   CHOLHDL 4 09/29/2015   Lab Results  Component Value Date   CREATININE 0.75 09/29/2015   Lab Results  Component Value Date   NA 139 09/29/2015   K 4.2 09/29/2015   CL 104 09/29/2015   CO2 28 09/29/2015   Lab Results  Component Value Date   ALT 15 09/29/2015   AST 16 09/29/2015   ALKPHOS 63 09/29/2015   BILITOT 0.4 09/29/2015   Lab Results  Component Value Date   TSH 3.27 09/29/2015   Current Outpatient Prescriptions on File Prior to Visit  Medication Sig Dispense Refill  . levothyroxine (SYNTHROID, LEVOTHROID) 50 MCG tablet TAKE 1 TABLET(50 MCG) BY MOUTH DAILY 30 tablet 6  . norgestimate-ethinyl estradiol (MONONESSA) 0.25-35 MG-MCG tablet Take 1 tablet by mouth daily. COMPLETE PHYSICAL EXAM REQUIRED FOR ADDITIONAL REFILLS 28 tablet 0  . Prenatal Vit-DSS-Fe Cbn-FA (PRENATAL MULTIVITAMIN-ULTRA PO) Take 1 tablet by mouth daily.       No current facility-administered medications on file prior to visit.     No Known Allergies  Past Medical History:  Diagnosis Date  . Anemia    iron deficiency  . Breast lump 2014  . Thyroid disease     Past Surgical History:  Procedure Laterality Date  . CESAREAN SECTION  2014  . CHOLECYSTECTOMY  2012  . FETAL BLOOD TRANSFUSION  2010  . FOOT SURGERY  2013     Family History  Problem Relation Age of Onset  . Hypertension Mother   . Diabetes Mother     Social History   Social History  . Marital status: Married    Spouse name: N/A  . Number of children: N/A  . Years of education: N/A   Occupational History  . Not on file.   Social History Main Topics  . Smoking status: Never Smoker  . Smokeless tobacco: Never Used  . Alcohol use Yes  . Drug use: No  . Sexual activity: Not on file   Other Topics Concern  . 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   Review of Systems  Constitutional: Negative.   HENT: Negative.   Eyes: Negative.   Respiratory: Negative.   Cardiovascular: Negative.   Gastrointestinal: Negative.   Endocrine: Negative.   Genitourinary: Negative.   Musculoskeletal: Negative.   Skin: Negative.   Allergic/Immunologic: Negative.   Neurological: Negative.   Hematological: Negative.   Psychiatric/Behavioral: Negative.   All other systems reviewed and are negative.      Objective:    BP 108/70 (BP Location: Left Arm, Patient  Position: Sitting, Cuff Size: Large)   Pulse 77   Temp 98.2 F (36.8 C) (Oral)   Ht 5' 1.75" (1.568 m)   Wt 195 lb (88.5 kg)   SpO2 99%   BMI 35.96 kg/m    Physical Exam   General:  Well-developed,well-nourished,in no acute distress; alert,appropriate and cooperative throughout examination Head:  normocephalic and atraumatic.   Eyes:  vision grossly intact, PERRL Ears:  R ear normal and L ear normal externally, TMs clear bilaterally Nose:  no external deformity.   Mouth:  good dentition.   Neck:  No deformities, masses, or tenderness noted. Breasts:  No mass, nodules, thickening, tenderness, bulging, retraction, inflamation, nipple discharge or skin changes noted.   Lungs:   Normal respiratory effort, chest expands symmetrically. Lungs are clear to auscultation, no crackles or wheezes. Heart:  Normal rate and regular rhythm. S1 and S2 normal without gallop, murmur, click, rub or other extra sounds. Abdomen:  Bowel sounds positive,abdomen soft and non-tender without masses, organomegaly or hernias noted. Rectal:  no external abnormalities.   Genitalia:  Pelvic Exam:        External: normal female genitalia without lesions or masses        Vagina: normal without lesions or masses        Cervix: normal without lesions or masses        Adnexa: normal bimanual exam without masses or fullness        Uterus: normal by palpation        Pap smear: performed Msk:  No deformity or scoliosis noted of thoracic or lumbar spine.   Extremities:  No clubbing, cyanosis, edema, or deformity noted with normal full range of motion of all joints.   Neurologic:  alert & oriented X3 and gait normal.   Skin:  Intact without suspicious lesions or rashes Cervical Nodes:  No lymphadenopathy noted Axillary Nodes:  No palpable lymphadenopathy Psych:  Cognition and judgment appear intact. Alert and cooperative with normal attention span and concentration. No apparent delusions, illusions, hallucinations       Assessment & Plan:   Routine general medical examination at a health care facility  Hypothyroidism, unspecified type  Hyperlipidemia, unspecified hyperlipidemia type No Follow-up on file.

## 2016-10-04 NOTE — Assessment & Plan Note (Signed)
Reviewed preventive care protocols, scheduled due services, and updated immunizations Discussed nutrition, exercise, diet, and healthy lifestyle.  

## 2016-10-04 NOTE — Assessment & Plan Note (Signed)
Labs today

## 2016-10-04 NOTE — Assessment & Plan Note (Signed)
Pap smear done today

## 2016-10-05 LAB — CYTOLOGY - PAP
Chlamydia: NEGATIVE
DIAGNOSIS: NEGATIVE
HPV (WINDOPATH): NOT DETECTED
Neisseria Gonorrhea: NEGATIVE
TRICH (WINDOWPATH): NEGATIVE

## 2016-10-10 LAB — CERVICOVAGINAL ANCILLARY ONLY: HERPES (WINDOWPATH): NEGATIVE

## 2016-10-16 ENCOUNTER — Other Ambulatory Visit: Payer: Self-pay | Admitting: Family Medicine

## 2016-11-09 ENCOUNTER — Other Ambulatory Visit: Payer: Self-pay | Admitting: Family Medicine

## 2016-12-08 ENCOUNTER — Telehealth: Payer: Self-pay

## 2016-12-08 MED ORDER — CYCLOBENZAPRINE HCL 5 MG PO TABS: 10.0000 mg | ORAL_TABLET | Freq: Three times a day (TID) | ORAL | 0 refills | Status: DC | PRN

## 2016-12-08 NOTE — Telephone Encounter (Signed)
Pt spoke with Meghan at front desk; pt having back pain; can see muscle because muscle is so tight. When stands very painful and body seems crooked when stands.No UTI symptoms. Pt seen 10/04/16 for annual. Pt has take ibuprofen 800 mg and using ice with no relief of pain. Pt request med for muscular pain sent to [facility] [address] and pt request cb.

## 2016-12-08 NOTE — Telephone Encounter (Signed)
eRx sent.  If symptoms do not improve, please make an appt to be seen.

## 2016-12-09 ENCOUNTER — Encounter (HOSPITAL_COMMUNITY): Payer: Self-pay | Admitting: *Deleted

## 2016-12-09 ENCOUNTER — Emergency Department (HOSPITAL_COMMUNITY)
Admission: EM | Admit: 2016-12-09 | Discharge: 2016-12-09 | Disposition: A | Discharge status: Home / Self Care | Payer: 59 | Attending: Emergency Medicine | Admitting: Emergency Medicine

## 2016-12-09 DIAGNOSIS — R55 Syncope and collapse: Secondary | ICD-10-CM

## 2016-12-09 DIAGNOSIS — E039 Hypothyroidism, unspecified: Secondary | ICD-10-CM | POA: Diagnosis not present

## 2016-12-09 DIAGNOSIS — M545 Low back pain: Secondary | ICD-10-CM | POA: Diagnosis not present

## 2016-12-09 LAB — URINALYSIS, ROUTINE W REFLEX MICROSCOPIC
BILIRUBIN URINE: NEGATIVE
Glucose, UA: NEGATIVE mg/dL
KETONES UR: NEGATIVE mg/dL
LEUKOCYTES UA: NEGATIVE
Nitrite: NEGATIVE
PROTEIN: 30 mg/dL — AB
SPECIFIC GRAVITY, URINE: 1.008 (ref 1.005–1.030)
pH: 6 (ref 5.0–8.0)

## 2016-12-09 LAB — I-STAT CHEM 8, ED
BUN: 19 mg/dL (ref 6–20)
CREATININE: 0.8 mg/dL (ref 0.44–1.00)
Calcium, Ion: 1.17 mmol/L (ref 1.15–1.40)
Chloride: 105 mmol/L (ref 101–111)
Glucose, Bld: 91 mg/dL (ref 65–99)
HEMATOCRIT: 39 % (ref 36.0–46.0)
Hemoglobin: 13.3 g/dL (ref 12.0–15.0)
Potassium: 4.1 mmol/L (ref 3.5–5.1)
Sodium: 140 mmol/L (ref 135–145)
TCO2: 24 mmol/L (ref 0–100)

## 2016-12-09 LAB — I-STAT BETA HCG BLOOD, ED (MC, WL, AP ONLY): I-stat hCG, quantitative: <5 m[IU]/mL (ref <5)

## 2016-12-09 MED ORDER — METHYLPREDNISOLONE 4 MG PO TBPK: ORAL_TABLET | ORAL | 0 refills | Status: DC

## 2016-12-09 NOTE — ED Provider Notes (Signed)
[facility]EM[facility]ENCY DEPT Provider Note   CSN: [id number] Arrival date & time: 12/09/16  0227     History   Chief Complaint Chief Complaint  Patient presents with  . Loss of Consciousness    HPI Kimberly Garner is a 31 y.o. female.  The history is provided by the patient and medical records. No language interpreter was used.  Loss of Consciousness   Associated symptoms include back pain. Pertinent negatives include abdominal pain, congestion, fever, headaches, nausea, vomiting and weakness.   Kimberly Garner is a 31 y.o. female  who presents to the Emergency Department complaining of syncopal episode and back pain. Patient states that left lower back pain began yesterday. Associated symptoms include shooting pain and tingling down left lower leg. She called her PCP who called in an rx for Flexeril. She has also been taking ibuprofen. Both of these have provided mild relief. Pain is worse with certain movements/positions. In the middle of the night, patient got up to use the restroom. She successfully used the restroom and was washing her hands when she believes that she lost consciousness. She states just prior to syncopal episode, she felt warm and lightheaded. She felt as if she was going to pass out, but could not sit down quick enough. Husband at bedside states that he heard a crash and came into the bathroom and noticed her on the floor. She was not responding for approximately a minute and then returned back to her baseline mental status. No incontinence or shaking. Patient denies head injury/headache, fevers/chills, chest pain, shortness of breath. Denies upper back or neck pain, Denies fever, saddle anesthesia, weakness,  urinary complaints including retention/incontinence/dysuria. No history of cancer, IVDU, or recent spinal procedures.  Past Medical History:  Diagnosis Date  . Anemia    iron deficiency  . Breast lump 2014  . Thyroid disease     Patient Active Problem List   Diagnosis Date Noted  . Routine general medical examination at a health care facility 07/15/2014  . Encounter for routine gynecological examination 07/15/2014  . Hidradenitis 07/15/2014  . CHOLELITHIASIS 10/10/2010  . HYPERTRIGLYCERIDEMIA 09/08/2010  . HLD (hyperlipidemia) 07/28/2010  . Hypothyroidism 04/25/2010  . ANEMIA-IRON DEFICIENCY 04/25/2010  . FATIGUE 04/25/2010  . ELEVATED BP READING WITHOUT DX HYPERTENSION 04/25/2010  . IRON DEFICIENCY ANEMIA, HX OF 04/25/2010    Past Surgical History:  Procedure Laterality Date  . CESAREAN SECTION  2014  . CHOLECYSTECTOMY  2012  . FETAL BLOOD TRANSFUSION  2010  . FOOT SURGERY  2013    OB History    Gravida Para Term Preterm AB Living   1 1       1    SAB TAB Ectopic Multiple Live Births                  Obstetric Comments   1st Menstrual Cycle:  11 1st Pregnancy:  26       Home Medications    Prior to Admission medications   Medication Sig Start Date End Date Taking? Authorizing Provider  cyclobenzaprine (FLEXERIL) 5 MG tablet Take 2 tablets (10 mg total) by mouth 3 (three) times daily as needed for muscle spasms. 12/08/16  Yes Dianne Dun, MD  levothyroxine (SYNTHROID, LEVOTHROID) 50 MCG tablet TAKE 1 TABLET(50 MCG) BY MOUTH DAILY 11/09/16  Yes Dianne Dun, MD  methylPREDNISolone (MEDROL DOSEPAK) 4 MG TBPK tablet Take as directed on package. 12/09/16   Kimberly Azizi Pilcher Nethan Caudillo, PA-C  MONONESSA 0.25-35 MG-MCG tablet TAKE 1 TABLET BY  MOUTH DAILY Patient not taking: Reported on 12/09/2016 10/16/16   Dianne Dunalia M Aron, MD    Family History Family History  Problem Relation Age of Onset  . Hypertension Mother   . Diabetes Mother     Social History Social History  Substance Use Topics  . Smoking status: Never Smoker  . Smokeless tobacco: Never Used  . Alcohol use Yes     Allergies   Patient has no known allergies.   Review of Systems Review of Systems  Constitutional: Negative for chills and fever.  HENT: Negative for  congestion.   Eyes: Negative for visual disturbance.  Respiratory: Negative for cough and shortness of breath.   Cardiovascular: Positive for syncope.  Gastrointestinal: Negative for abdominal pain, nausea and vomiting.  Genitourinary: Negative for dysuria.  Musculoskeletal: Positive for back pain. Negative for neck pain.  Skin: Negative for rash.  Neurological: Positive for syncope. Negative for weakness and headaches.     Physical Exam Updated Vital Signs BP 105/64   Pulse 76   Temp 98 F (36.7 C) (Oral)   Resp 15   Ht 5\' 2"  (1.575 m)   Wt 88 kg   LMP 12/09/2016   SpO2 98%   BMI 35.48 kg/m   Physical Exam  Constitutional: She is oriented to person, place, and time. She appears well-developed and well-nourished.  HENT:  Head: Normocephalic and atraumatic.  No evidence of tongue biting.   Neck:  Full ROM without pain. No midline tenderness. No tenderness of paraspinal musculature  Cardiovascular: Normal rate, regular rhythm, normal heart sounds and intact distal pulses.  Exam reveals no gallop and no friction rub.   No murmur heard. Pulmonary/Chest: Effort normal and breath sounds normal. No respiratory distress. She has no wheezes. She has no rales.  Abdominal: Soft. Bowel sounds are normal. She exhibits no distension. There is no tenderness.  No CVA tenderness.   Musculoskeletal:  + midline and left sided lumbar tenderness with no overlying skin changes. Full ROM. Straight leg raises are negative on right, + on left for radicular symptoms. 5/5 muscle strength of bilateral LE's  Neurological: She is alert and oriented to person, place, and time. She has normal reflexes.  Skin: Skin is warm and dry. No rash noted. No erythema.  Nursing note and vitals reviewed.    ED Treatments / Results  Labs (all labs ordered are listed, but only abnormal results are displayed) Labs Reviewed  URINALYSIS, ROUTINE W REFLEX MICROSCOPIC - Abnormal; Notable for the following:        Result Value   Color, Urine RED (*)    APPearance CLOUDY (*)    Hgb urine dipstick LARGE (*)    Protein, ur 30 (*)    Bacteria, UA RARE (*)    Squamous Epithelial / LPF 0-5 (*)    All other components within normal limits  I-STAT BETA HCG BLOOD, ED ([facility], [facility], [facility] ONLY)  I-STAT CHEM 8, ED    EKG  EKG Interpretation  Date/Time:  Saturday December 09 2016 02:33:44 EST Ventricular Rate:  83 PR Interval:  156 QRS Duration: 88 QT Interval:  366 QTC Calculation: 430 R Axis:   43 Text Interpretation:  Normal sinus rhythm Normal ECG No old tracing to compare Confirmed by Dracen Reigle,  DO, KRISTEN ([id number]) on 12/09/2016 3:07:08 AM       Radiology No results found.  Procedures Procedures (including critical care time)  Medications Ordered in ED Medications - No data to display   Initial Impression /  Assessment and Plan / ED Course  I have reviewed the triage vital signs and the nursing notes.  Pertinent labs & imaging results that were available during my care of the patient were reviewed by me and considered in my medical decision making (see chart for details).    Kimberly Garner is a 31 y.o. female who presents to ED for back pain x 2 days, syncopal episode just prior to arrival. EKG NSR. Labs reassuring. hcg negative. Likely vasovagal syncope. Patient does have back pain and likely radiculopathy. Discussed with patient that she will need an MRI as an outpatient which her PCP can schedule. Patient has PCP whom she can follow up with and states she will call Monday to schedule this appointment. I have reviewed return precautions, including the development of any red flag signs or symptoms and the patient has voiced understanding.  RX for medrol dose pack. Patient voiced understanding and agreement with plan.   Patient discussed with Dr. Elesa Massed who agrees with treatment plan.   Final Clinical Impressions(s) / ED Diagnoses   Final diagnoses:  Acute left-sided low back pain, with sciatica  presence unspecified  Syncope, unspecified syncope type    New Prescriptions Discharge Medication List as of 12/09/2016  5:02 AM    START taking these medications   Details  methylPREDNISolone (MEDROL DOSEPAK) 4 MG TBPK tablet Take as directed on package., Print         [facility] Milayna Rotenberg, PA-C 12/09/16 0731    Layla Maw Ceasar Decandia, DO 12/09/16 [phone]

## 2016-12-09 NOTE — ED Triage Notes (Signed)
The pt has been having lower back spasms today and just pts she fainted at home  At present she has back pain no known injury  lmp now

## 2016-12-09 NOTE — Discharge Instructions (Signed)
Take steroid dose pack as directed. Continue taking ibuprofen as needed for pain. Continue taking your muscle relaxer. Ice affected area for additional pain relief. Please follow up with your primary care provider. Call their office on Monday to schedule a follow up appointment.   COLD THERAPY DIRECTIONS:  Ice or gel packs can be used to reduce both pain and swelling. Ice is the most helpful within the first 24 to 48 hours after an injury or flareup from overusing a muscle or joint.  Ice is effective, has very few side effects, and is safe for most people to use.   If you expose your skin to cold temperatures for too long or without the proper protection, you can damage your skin or nerves. Watch for signs of skin damage due to cold.   HOME CARE INSTRUCTIONS  Follow these tips to use ice and cold packs safely.  Place a dry or damp towel between the ice and skin. A damp towel will cool the skin more quickly, so you may need to shorten the time that the ice is used.  For a more rapid response, add gentle compression to the ice.  Ice for no more than 10 to 20 minutes at a time. The bonier the area you are icing, the less time it will take to get the benefits of ice.  Check your skin after 5 minutes to make sure there are no signs of a poor response to cold or skin damage.  Rest 20 minutes or more in between uses.  Once your skin is numb, you can end your treatment. You can test numbness by very lightly touching your skin. The touch should be so light that you do not see the skin dimple from the pressure of your fingertip. When using ice, most people will feel these normal sensations in this order: cold, burning, aching, and numbness.   Be aware that if you develop new symptoms, such as a fever, leg weakness, difficulty with or loss of control of your urine or bowels, abdominal pain, or more severe pain, you will need to seek medical attention and  / or return to the Emergency department.

## 2016-12-09 NOTE — ED Notes (Signed)
Please note urine is bloody... Pt is on menstrual cycle.

## 2016-12-11 ENCOUNTER — Ambulatory Visit (INDEPENDENT_AMBULATORY_CARE_PROVIDER_SITE_OTHER): Payer: 59 | Admitting: Family Medicine

## 2016-12-11 ENCOUNTER — Encounter: Payer: Self-pay | Admitting: Family Medicine

## 2016-12-11 DIAGNOSIS — M5440 Lumbago with sciatica, unspecified side: Secondary | ICD-10-CM | POA: Diagnosis not present

## 2016-12-11 DIAGNOSIS — R55 Syncope and collapse: Secondary | ICD-10-CM | POA: Diagnosis not present

## 2016-12-11 DIAGNOSIS — M549 Dorsalgia, unspecified: Secondary | ICD-10-CM | POA: Insufficient documentation

## 2016-12-11 MED ORDER — TRAMADOL HCL 50 MG PO TABS
50.0000 mg | ORAL_TABLET | Freq: Three times a day (TID) | ORAL | 0 refills | Status: DC | PRN
Start: 2016-12-11 — End: 2017-06-01

## 2016-12-11 NOTE — Assessment & Plan Note (Signed)
Exam reassuring. Agree likely vasovagal. No further work up needed at this time. Call or return to clinic prn if these symptoms worsen or fail to improve as anticipated. The patient indicates understanding of these issues and agrees with the plan.

## 2016-12-11 NOTE — Progress Notes (Signed)
Pre visit review using our clinic review tool, if applicable. No additional management support is needed unless otherwise documented below in the visit note. 

## 2016-12-11 NOTE — Progress Notes (Signed)
Subjective:   Patient ID: Kimberly Garner, female    DOB: [birth year], 31 y.o.   MRN: [id number]  Kimberly Garner is a pleasant 31 y.o. year old female who presents to clinic today with No chief complaint on file.  on 12/11/2016  HPI:  Notes reviewed.  Presented to ER after she had a syncopal episode.  Had left lower back pain earlier in the day that radiated down her left leg.  She called our office and asked for refill of flexeril.  Was taking this along with Ibuprofen.  Did not feel much relief.  Got up to go to the bathroom and had a syncopal episode while she was washing her hands.  Felt warm and lightheaded before she fainted.  Husband heard a crash came into the bathroom.  She did not respond for approximately 1 minute and then returned to her baseline mental status.  No shaking or incontinence.  EKG- NSR. hcg neg  Felt likely msk back pain with vasovagal syncope.  Given rx for medrol dose pack.  Back is feeling better but still having some sharp pain.  No recurrent syncope.  No results found.  Current Outpatient Prescriptions on File Prior to Visit  Medication Sig Dispense Refill  . cyclobenzaprine (FLEXERIL) 5 MG tablet Take 2 tablets (10 mg total) by mouth 3 (three) times daily as needed for muscle spasms. 30 tablet 0  . levothyroxine (SYNTHROID, LEVOTHROID) 50 MCG tablet TAKE 1 TABLET(50 MCG) BY MOUTH DAILY 90 tablet 2  . methylPREDNISolone (MEDROL DOSEPAK) 4 MG TBPK tablet Take as directed on package. 1 tablet 0  . MONONESSA 0.25-35 MG-MCG tablet TAKE 1 TABLET BY MOUTH DAILY (Patient not taking: Reported on 12/09/2016) 3 Package 2   No current facility-administered medications on file prior to visit.     No Known Allergies  Past Medical History:  Diagnosis Date  . Anemia    iron deficiency  . Breast lump 2014  . Thyroid disease     Past Surgical History:  Procedure Laterality Date  . CESAREAN SECTION  2014  . CHOLECYSTECTOMY  2012  . FETAL BLOOD  TRANSFUSION  2010  . FOOT SURGERY  2013    Family History  Problem Relation Age of Onset  . Hypertension Mother   . Diabetes Mother     Social History   Social History  . Marital status: Married    Spouse name: N/A  . Number of children: N/A  . Years of education: N/A   Occupational History  . Not on file.   Social History Main Topics  . Smoking status: Never Smoker  . Smokeless tobacco: Never Used  . Alcohol use Yes  . Drug use: No  . Sexual activity: Not on file   Other Topics Concern  . Not on file   Social History Narrative   Works in Location manager at [employer]   Married   The PMH, PSH, Social History, Family History, Medications, and allergies have been reviewed in [facility], and have been updated if relevant.   Review of Systems  Constitutional: Negative.   Respiratory: Negative.   Cardiovascular: Negative.   Gastrointestinal: Negative.   Genitourinary: Negative.   Musculoskeletal: Positive for back pain. Negative for arthralgias, gait problem, joint swelling, myalgias, neck pain and neck stiffness.  Neurological: Positive for dizziness and syncope. Negative for tremors, seizures, facial asymmetry, speech difficulty, weakness, light-headedness, numbness and headaches.  Hematological: Negative.   Psychiatric/Behavioral: Negative.   All other systems reviewed and are negative.  Objective:    LMP 12/09/2016    Physical Exam  Constitutional: She is oriented to person, place, and time. She appears well-developed and well-nourished. No distress.  HENT:  Head: Normocephalic and atraumatic.  Eyes: Conjunctivae are normal.  Cardiovascular: Normal rate and regular rhythm.   Pulmonary/Chest: Effort normal and breath sounds normal.  Musculoskeletal:       Lumbar back: She exhibits pain and spasm. She exhibits normal range of motion, no tenderness, no bony tenderness, no swelling, no edema, no deformity, no laceration and normal pulse.  Neurological:  She is alert and oriented to person, place, and time. No cranial nerve deficit. Coordination normal.  Skin: Skin is warm and dry. She is not diaphoretic.  Psychiatric: She has a normal mood and affect. Her behavior is normal. Judgment and thought content normal.  Nursing note and vitals reviewed.         Assessment & Plan:   Low back pain with sciatica, sciatica laterality unspecified, unspecified back pain laterality, unspecified chronicity  Syncope, unspecified syncope type No Follow-up on file.

## 2016-12-11 NOTE — Assessment & Plan Note (Signed)
Improving with medrol dose pack. Given rx for tramadol to take as needed for severe pain as I would like for her to avoid taking NSAIDs with steroids. Discussed sedation and addiction precautions. Call or return to clinic prn if these symptoms worsen or fail to improve as anticipated. The patient indicates understanding of these issues and agrees with the plan.

## 2017-01-17 DIAGNOSIS — M9903 Segmental and somatic dysfunction of lumbar region: Secondary | ICD-10-CM | POA: Diagnosis not present

## 2017-01-17 DIAGNOSIS — M545 Low back pain: Secondary | ICD-10-CM | POA: Diagnosis not present

## 2017-01-17 DIAGNOSIS — M5137 Other intervertebral disc degeneration, lumbosacral region: Secondary | ICD-10-CM | POA: Diagnosis not present

## 2017-01-19 DIAGNOSIS — M5137 Other intervertebral disc degeneration, lumbosacral region: Secondary | ICD-10-CM | POA: Diagnosis not present

## 2017-01-19 DIAGNOSIS — M545 Low back pain: Secondary | ICD-10-CM | POA: Diagnosis not present

## 2017-01-19 DIAGNOSIS — M9903 Segmental and somatic dysfunction of lumbar region: Secondary | ICD-10-CM | POA: Diagnosis not present

## 2017-01-22 DIAGNOSIS — M545 Low back pain: Secondary | ICD-10-CM | POA: Diagnosis not present

## 2017-01-22 DIAGNOSIS — M9903 Segmental and somatic dysfunction of lumbar region: Secondary | ICD-10-CM | POA: Diagnosis not present

## 2017-01-22 DIAGNOSIS — M5137 Other intervertebral disc degeneration, lumbosacral region: Secondary | ICD-10-CM | POA: Diagnosis not present

## 2017-01-24 DIAGNOSIS — M5137 Other intervertebral disc degeneration, lumbosacral region: Secondary | ICD-10-CM | POA: Diagnosis not present

## 2017-01-24 DIAGNOSIS — M9903 Segmental and somatic dysfunction of lumbar region: Secondary | ICD-10-CM | POA: Diagnosis not present

## 2017-01-24 DIAGNOSIS — M545 Low back pain: Secondary | ICD-10-CM | POA: Diagnosis not present

## 2017-01-30 DIAGNOSIS — M5137 Other intervertebral disc degeneration, lumbosacral region: Secondary | ICD-10-CM | POA: Diagnosis not present

## 2017-01-30 DIAGNOSIS — M545 Low back pain: Secondary | ICD-10-CM | POA: Diagnosis not present

## 2017-01-30 DIAGNOSIS — M9903 Segmental and somatic dysfunction of lumbar region: Secondary | ICD-10-CM | POA: Diagnosis not present

## 2017-02-05 DIAGNOSIS — M9903 Segmental and somatic dysfunction of lumbar region: Secondary | ICD-10-CM | POA: Diagnosis not present

## 2017-02-05 DIAGNOSIS — M5137 Other intervertebral disc degeneration, lumbosacral region: Secondary | ICD-10-CM | POA: Diagnosis not present

## 2017-02-05 DIAGNOSIS — M545 Low back pain: Secondary | ICD-10-CM | POA: Diagnosis not present

## 2017-02-12 DIAGNOSIS — M5137 Other intervertebral disc degeneration, lumbosacral region: Secondary | ICD-10-CM | POA: Diagnosis not present

## 2017-02-12 DIAGNOSIS — M545 Low back pain: Secondary | ICD-10-CM | POA: Diagnosis not present

## 2017-02-12 DIAGNOSIS — M9903 Segmental and somatic dysfunction of lumbar region: Secondary | ICD-10-CM | POA: Diagnosis not present

## 2017-02-19 DIAGNOSIS — M5137 Other intervertebral disc degeneration, lumbosacral region: Secondary | ICD-10-CM | POA: Diagnosis not present

## 2017-02-19 DIAGNOSIS — M545 Low back pain: Secondary | ICD-10-CM | POA: Diagnosis not present

## 2017-02-19 DIAGNOSIS — M9903 Segmental and somatic dysfunction of lumbar region: Secondary | ICD-10-CM | POA: Diagnosis not present

## 2017-06-01 ENCOUNTER — Ambulatory Visit (INDEPENDENT_AMBULATORY_CARE_PROVIDER_SITE_OTHER): Payer: 59 | Admitting: Family Medicine

## 2017-06-01 ENCOUNTER — Telehealth: Payer: Self-pay | Admitting: Family Medicine

## 2017-06-01 ENCOUNTER — Other Ambulatory Visit: Payer: Self-pay | Admitting: Family Medicine

## 2017-06-01 ENCOUNTER — Encounter: Payer: Self-pay | Admitting: Family Medicine

## 2017-06-01 VITALS — BP 108/72 | HR 84 | Temp 98.3°F | Ht 62.0 in | Wt 192.1 lb

## 2017-06-01 DIAGNOSIS — M7989 Other specified soft tissue disorders: Secondary | ICD-10-CM | POA: Diagnosis not present

## 2017-06-01 DIAGNOSIS — E039 Hypothyroidism, unspecified: Secondary | ICD-10-CM

## 2017-06-01 LAB — CBC WITH DIFFERENTIAL/PLATELET
BASOS ABS: 0 10*3/uL (ref 0.0–0.1)
Basophils Relative: 0.4 % (ref 0.0–3.0)
EOS ABS: 0.1 10*3/uL (ref 0.0–0.7)
EOS PCT: 2.1 % (ref 0.0–5.0)
HCT: 33.7 % — ABNORMAL LOW (ref 36.0–46.0)
HEMOGLOBIN: 11.7 g/dL — AB (ref 12.0–15.0)
LYMPHS ABS: 1 10*3/uL (ref 0.7–4.0)
Lymphocytes Relative: 15.6 % (ref 12.0–46.0)
MCHC: 34.7 g/dL (ref 30.0–36.0)
MCV: 92.6 fl (ref 78.0–100.0)
MONO ABS: 0.5 10*3/uL (ref 0.1–1.0)
Monocytes Relative: 7.3 % (ref 3.0–12.0)
NEUTROS PCT: 74.6 % (ref 43.0–77.0)
Neutro Abs: 4.8 10*3/uL (ref 1.4–7.7)
Platelets: 243 10*3/uL (ref 150.0–400.0)
RBC: 3.64 Mil/uL — ABNORMAL LOW (ref 3.87–5.11)
RDW: 12.5 % (ref 11.5–15.5)
WBC: 6.4 10*3/uL (ref 4.0–10.5)

## 2017-06-01 LAB — COMPREHENSIVE METABOLIC PANEL
ALBUMIN: 3.7 g/dL (ref 3.5–5.2)
ALK PHOS: 56 U/L (ref 39–117)
ALT: 21 U/L (ref 0–35)
AST: 19 U/L (ref 0–37)
BUN: 10 mg/dL (ref 6–23)
CALCIUM: 8.7 mg/dL (ref 8.4–10.5)
CHLORIDE: 106 meq/L (ref 96–112)
CO2: 27 mEq/L (ref 19–32)
CREATININE: 0.76 mg/dL (ref 0.40–1.20)
GFR: 94.19 mL/min (ref >60.00)
Glucose, Bld: 83 mg/dL (ref 70–99)
Potassium: 4.1 mEq/L (ref 3.5–5.1)
SODIUM: 138 meq/L (ref 135–145)
TOTAL PROTEIN: 6.7 g/dL (ref 6.0–8.3)
Total Bilirubin: 0.3 mg/dL (ref 0.2–1.2)

## 2017-06-01 LAB — POCT URINE PREGNANCY: PREG TEST UR: NEGATIVE

## 2017-06-01 LAB — TSH: TSH: 5.97 u[IU]/mL — AB (ref 0.35–4.50)

## 2017-06-01 LAB — T4, FREE: Free T4: 0.86 ng/dL (ref 0.60–1.60)

## 2017-06-01 MED ORDER — LEVOTHYROXINE SODIUM 75 MCG PO TABS: 75.0000 ug | ORAL_TABLET | Freq: Every day | ORAL | 0 refills | Status: DC

## 2017-06-01 NOTE — Telephone Encounter (Signed)
Released via mychart

## 2017-06-01 NOTE — Patient Instructions (Signed)
For swelling - labs today, urine checked today.  Make sure to drink plenty of water, avoid high sodium/salt foods, and elevate legs.  We will be in touch with lab results.

## 2017-06-01 NOTE — Progress Notes (Signed)
BP 108/72 (BP Location: Left Arm, Patient Position: Sitting)   Pulse 84   Temp 98.3 F (36.8 C) (Oral)   Ht 5\' 2"  (1.575 m)   Wt 192 lb 1.9 oz (87.1 kg)   LMP 05/27/2017   SpO2 98%   BMI 35.14 kg/m    CC: hand and foot swelling Subjective:    Patient ID: Kimberly Garner, female    DOB: 10/07/1986, 31 y.o.   MRN: [id number]  HPI: Kimberly LownHeather Zajicek is a 31 y.o. female presenting on 06/01/2017 for Foot Swelling (both feet, has a tingling feeling,sx started on monday, pt has tried taking some asa 81 ) and Hand Problem (both hands, has tingling feeling, sx started on monday , pt states she has a hard time gripping things)   5d h/o bilateral hand and foot swelling associated with paresthesias and R hand numbness (today feels better). Also noticing increasing fatigue and headaches. Some pain at wrists and ankles. Denies fevers/chills, active synovitis, nausea, abd pain, new rashes.  No increased salt in diet, recent barbeque, no recent change in repetitive movements of hands.  Hypothyroidism compliant with levothyroxine 50mcg daily.  Scalp feels drier than normal. Denies cold intolerance, constipation, unexpected weight loss  Lab Results  Component Value Date   TSH 4.18 10/04/2016    Doesn't think she's pregnant - she regularly takes mononessa OCP.  She takes prenatal vitamin.  Currently on period - started Sunday.   Relevant past medical, surgical, family and social history reviewed and updated as indicated. Interim medical history since our last visit reviewed. Allergies and medications reviewed and updated. Outpatient Medications Prior to Visit  Medication Sig Dispense Refill  . cyclobenzaprine (FLEXERIL) 5 MG tablet Take 2 tablets (10 mg total) by mouth 3 (three) times daily as needed for muscle spasms. 30 tablet 0  . levothyroxine (SYNTHROID, LEVOTHROID) 50 MCG tablet TAKE 1 TABLET(50 MCG) BY MOUTH DAILY 90 tablet 2  . MONONESSA 0.25-35 MG-MCG tablet TAKE 1 TABLET BY MOUTH DAILY 3  Package 2  . methylPREDNISolone (MEDROL DOSEPAK) 4 MG TBPK tablet Take as directed on package. (Patient not taking: Reported on 06/01/2017) 1 tablet 0  . traMADol (ULTRAM) 50 MG tablet Take 1 tablet (50 mg total) by mouth every 8 (eight) hours as needed. (Patient not taking: Reported on 06/01/2017) 30 tablet 0   No facility-administered medications prior to visit.      Per HPI unless specifically indicated in ROS section below Review of Systems     Objective:    BP 108/72 (BP Location: Left Arm, Patient Position: Sitting)   Pulse 84   Temp 98.3 F (36.8 C) (Oral)   Ht 5\' 2"  (1.575 m)   Wt 192 lb 1.9 oz (87.1 kg)   LMP 05/27/2017   SpO2 98%   BMI 35.14 kg/m   Wt Readings from Last 3 Encounters:  06/01/17 192 lb 1.9 oz (87.1 kg)  12/11/16 192 lb 8 oz (87.3 kg)  12/09/16 194 lb (88 kg)    Physical Exam  Constitutional: She is oriented to person, place, and time. She appears well-developed and well-nourished. No distress.  HENT:  Head: Normocephalic and atraumatic.  Mouth/Throat: Oropharynx is clear and moist. No oropharyngeal exudate.  Eyes: Pupils are equal, round, and reactive to light. Conjunctivae and EOM are normal. No scleral icterus.  Neck: Normal range of motion. Neck supple. No thyromegaly (small R nodule) present.  Cardiovascular: Normal rate, regular rhythm, normal heart sounds and intact distal pulses.   No murmur  heard. Pulmonary/Chest: Effort normal and breath sounds normal. No respiratory distress. She has no wheezes. She has no rales.  Abdominal: Soft. Normal appearance and bowel sounds are normal. She exhibits no distension. There is no hepatosplenomegaly. There is no tenderness. There is no CVA tenderness.  Musculoskeletal: She exhibits no edema.  No pitting edema Mild edema of hands and feet predominantly affecting digits Mild discomfort to palpation at with flexion at wrists 2+ rad pulses bilaterally  Lymphadenopathy:    She has no cervical adenopathy.    Neurological: She is alert and oriented to person, place, and time. She has normal strength. No sensory deficit.  Neg phalen, tinel Sensation intact  Skin: Skin is warm and dry. No rash noted. No erythema.  Psychiatric: She has a normal mood and affect.  Nursing note and vitals reviewed.  Results for orders placed or performed during the hospital encounter of 12/09/16  Urinalysis, Routine w reflex microscopic  Result Value Ref Range   Color, Urine RED (A) YELLOW   APPearance CLOUDY (A) CLEAR   Specific Gravity, Urine 1.008 1.005 - 1.030   pH 6.0 5.0 - 8.0   Glucose, UA NEGATIVE NEGATIVE mg/dL   Hgb urine dipstick LARGE (A) NEGATIVE   Bilirubin Urine NEGATIVE NEGATIVE   Ketones, ur NEGATIVE NEGATIVE mg/dL   Protein, ur 30 (A) NEGATIVE mg/dL   Nitrite NEGATIVE NEGATIVE   Leukocytes, UA NEGATIVE NEGATIVE   RBC / HPF TOO NUMEROUS TO COUNT 0 - 5 RBC/hpf   WBC, UA TOO NUMEROUS TO COUNT 0 - 5 WBC/hpf   Bacteria, UA RARE (A) NONE SEEN   Squamous Epithelial / LPF 0-5 (A) NONE SEEN   Mucous PRESENT   I-Stat Beta hCG blood, ED ([facility], [facility], [facility] only)  Result Value Ref Range   I-stat hCG, quantitative <5.0 <5 mIU/mL   Comment 3          I-stat chem 8, ed  Result Value Ref Range   Sodium 140 135 - 145 mmol/L   Potassium 4.1 3.5 - 5.1 mmol/L   Chloride 105 101 - 111 mmol/L   BUN 19 6 - 20 mg/dL   Creatinine, Ser 4.090.80 0.44 - 1.00 mg/dL   Glucose, Bld 91 65 - 99 mg/dL   Calcium, Ion 8.111.17 9.141.15 - 1.40 mmol/L   TCO2 24 0 - 100 mmol/L   Hemoglobin 13.3 12.0 - 15.0 g/dL   HCT 78.239.0 95.636.0 - 21.346.0 %      Assessment & Plan:   Problem List Items Addressed This Visit    Hypothyroidism   Relevant Orders   T3   Swelling of extremity - Primary    Unclear etiology. Not consistent with CTS.  In h/o hypothyroidism, check TFTs. Will also check CMP, CBC, Upreg.  Discussed supportive care of extremity elevation, increased water, avoiding NSAIDs and salt/sodium in diet.       Relevant Orders    Comprehensive metabolic panel   TSH   CBC with Differential/Platelet   T4, free   POCT urine pregnancy       Follow up plan: Return if symptoms worsen or fail to improve.  Eustaquio BoydenJavier Seiya Silsby, MD

## 2017-06-01 NOTE — Assessment & Plan Note (Addendum)
Unclear etiology. Not consistent with CTS.  In h/o hypothyroidism, check TFTs. Will also check CMP, CBC, Upreg.  Discussed supportive care of extremity elevation, increased water, avoiding NSAIDs and salt/sodium in diet.

## 2017-06-01 NOTE — Telephone Encounter (Signed)
Patient called. She received the results of her urine test, but not the rest of the lab results.  Patient would like to know the results.

## 2017-06-02 LAB — T3: T3 TOTAL: 113.9 ng/dL (ref 76–181)

## 2017-06-03 ENCOUNTER — Other Ambulatory Visit: Payer: Self-pay | Admitting: Family Medicine

## 2017-08-14 ENCOUNTER — Encounter: Payer: Self-pay | Admitting: Obstetrics and Gynecology

## 2017-08-14 ENCOUNTER — Ambulatory Visit (INDEPENDENT_AMBULATORY_CARE_PROVIDER_SITE_OTHER): Payer: 59 | Admitting: Obstetrics and Gynecology

## 2017-08-14 VITALS — BP 98/62 | HR 78 | Ht 62.0 in | Wt 194.3 lb

## 2017-08-14 DIAGNOSIS — E038 Other specified hypothyroidism: Secondary | ICD-10-CM | POA: Diagnosis not present

## 2017-08-14 DIAGNOSIS — Z98891 History of uterine scar from previous surgery: Secondary | ICD-10-CM | POA: Diagnosis not present

## 2017-08-14 DIAGNOSIS — N946 Dysmenorrhea, unspecified: Secondary | ICD-10-CM | POA: Diagnosis not present

## 2017-08-14 DIAGNOSIS — E282 Polycystic ovarian syndrome: Secondary | ICD-10-CM | POA: Diagnosis not present

## 2017-08-14 DIAGNOSIS — Z9289 Personal history of other medical treatment: Secondary | ICD-10-CM | POA: Diagnosis not present

## 2017-08-14 DIAGNOSIS — E66813 Obesity, class 3: Secondary | ICD-10-CM | POA: Insufficient documentation

## 2017-08-14 DIAGNOSIS — E669 Obesity, unspecified: Secondary | ICD-10-CM | POA: Diagnosis not present

## 2017-08-14 DIAGNOSIS — Z6841 Body Mass Index (BMI) 40.0 and over, adult: Secondary | ICD-10-CM | POA: Insufficient documentation

## 2017-08-14 DIAGNOSIS — N921 Excessive and frequent menstruation with irregular cycle: Secondary | ICD-10-CM

## 2017-08-14 DIAGNOSIS — Z9889 Other specified postprocedural states: Secondary | ICD-10-CM

## 2017-08-14 HISTORY — DX: Personal history of other medical treatment: Z92.89

## 2017-08-14 HISTORY — DX: Other specified postprocedural states: Z98.890

## 2017-08-14 NOTE — Progress Notes (Signed)
GYN ENCOUNTER NOTE  Subjective:       Kimberly Garner is a 31 y.o. G51P1001 female is here for gynecologic evaluation of the following issues:  1. Abnormal uterine bleeding 2. History of blood transfusion 3. Severe dysmenorrhea  31 year old married female para 1001, currently on birth control pills for menstrual cycle regulation and control of menorrhagia and dysmenorrhea. History of blood transfusion for menorrhagia in 2010 Menarche-age 4 Intervals-1-4 weeks Duration of flow 7-14 days History of severe dysmenorrhea, 10/10 in intensity; dysmenorrhea notable for bilateral pelvic cramping along with low back pain with radiation into the buttocks; patient has missed work and school because of dysmenorrhea No history of persistent deep thrusting dyspareunia No history of abnormal Pap smears No history of STDs. Family history of severe menstrual cramps without diagnosis of endometriosis in sisters and mom OCPs not regulating menses adequately. Insurance seems to change pill brand every 3 months.   Obstetric History OB History  Gravida Para Term Preterm AB Living  SAB TAB Ectopic Multiple Live Births          1    # Outcome Date GA Lbr Len/2nd Weight Sex Delivery Anes PTL Lv  1 Term 2014   8 lb 2.4 oz (3.697 kg) M CS-LTranv   LIV    Obstetric Comments  1st Menstrual Cycle:  11  1st Pregnancy:  26    Past Medical History:  Diagnosis Date  . Anemia    iron deficiency  . Breast lump 2014  . Thyroid disease     Past Surgical History:  Procedure Laterality Date  . CESAREAN SECTION  2014  . CHOLECYSTECTOMY  2012  . FETAL BLOOD TRANSFUSION  2010  . FOOT SURGERY  2013    Current Outpatient Prescriptions on File Prior to Visit  Medication Sig Dispense Refill  . norgestimate-ethinyl estradiol (ORTHO-CYCLEN,SPRINTEC,PREVIFEM) 0.25-35 MG-MCG tablet TAKE 1 TABLET BY MOUTH DAILY 84 tablet 0  . Prenatal Vit-Fe Fumarate-FA (PRENATAL VITAMIN PO)     . levothyroxine  (SYNTHROID, LEVOTHROID) 75 MCG tablet Take 1 tablet (75 mcg total) by mouth daily before breakfast. 90 tablet 0   No current facility-administered medications on file prior to visit.     No Known Allergies  Social History   Social History  . Marital status: Married    Spouse name: N/A  . Number of children: N/A  . Years of education: N/A   Occupational History  . Not on file.   Social History Main Topics  . Smoking status: Never Smoker  . Smokeless tobacco: Never Used  . Alcohol use No  . Drug use: No  . Sexual activity: Yes    Birth control/ protection: Pill   Other Topics Concern  . Not on file   Social History Narrative   Works in front office at [employer]   Married    Family History  Problem Relation Age of Onset  . Hypertension Mother   . Diabetes Mother   . Breast cancer Neg Hx   . Ovarian cancer Neg Hx   . Colon cancer Neg Hx     The following portions of the patient's history were reviewed and updated as appropriate: allergies, current medications, past family history, past medical history, past social history, past surgical history and problem list.  Review of Systems Review of Systems -comprehensive review of systems is negative except for that noted in the history of present illness  Objective:   BP 98/62  Pulse 78   Ht  (1.575 m)   Wt 194 lb 4.8 oz (88.1 kg)   LMP 08/06/2017 (Exact Date)   BMI 35.54 kg/m  CONSTITUTIONAL: Well-developed, well-nourished female in no acute distress.  HENT:  Normocephalic, atraumatic.  NECK: Normal range of motion, supple, no masses.  Normal thyroid.  SKIN: Skin is warm and dry. No rash noted. Not diaphoretic. No erythema. No pallor. NEUROLGIC: Alert and oriented to person, place, and time. PSYCHIATRIC: Normal mood and affect. Normal behavior. Normal judgment and thought content. CARDIOVASCULAR:Not Examined RESPIRATORY: Not Examined BREASTS: Not Examined ABDOMEN: Soft, non distended; Non  tender.  No Organomegaly.medium pannus. Transverse incision is well-healed without evidence of hernia PELVIC:  External Genitalia: Normal  BUS: Normal  Vagina: Normal  Cervix: Normal; no lesions; no cervical motion tenderness  Uterus: midplane, 1/4 tender, normal size and shape andconsistency, mobile  Adnexa: Normal; nonpalpable nontender  RV: Normal external exam  Bladder: Nontender MUSCULOSKELETAL: Normal range of motion. No tenderness.  No cyanosis, clubbing, or edema.     Assessment:   1. Dysmenorrhea - US PELVIS (TRANSABDOMINAL ONLY); Future - US PELVIS TRANSVANGINAL NON-OB (TV ONLY); Future  2. Menorrhagia with irregular cycle - US PELVIS (TRANSABDOMINAL ONLY); Future - US PELVIS TRANSVANGINAL NON-OB (TV ONLY); Future  3. History of blood transfusion  4. History of cesarean section  5. Obesity (BMI 35.0-39.9 without comorbidity)  6. Other specified hypothyroidism - TSH  7. PCO (polycystic ovaries); does shave     Plan:   1. Pelvic ultrasound is ordered 2. Continue with OCPs as written 3. Follow-up in 3 week possible endometrial biopsy and further management planning 4. Consider laparoscopy with peritoneal biopsies to rule out endometriosis 5. Options of management of abnormal uterine bleeding, dysmenorrhea have been reviewed. Literature is given on laparoscopy and endometrial ablation.  Herold Harms, MD  Note: This dictation was prepared with Dragon dictation along with smaller phrase technology. Any transcriptional errors that result from this process are unintentional.

## 2017-08-14 NOTE — Patient Instructions (Signed)
1. TSH is obtained today 2. Pelvic ultrasound is ordered 3. Return in 3 weeks for follow-up and further management planning regarding abnormal uterine bleeding and dysmenorrea 4. Continue taking ibuprofen 800 mg 2 or 3 times a day 5. Continue taking birth control pills daily  Endometrial Ablation Endometrial ablation is a procedure that destroys the thin inner layer of the lining of the uterus (endometrium). This procedure may be done:  To stop heavy periods.  To stop bleeding that is causing anemia.  To control irregular bleeding.  To treat bleeding caused by small tumors (fibroids) in the endometrium.  This procedure is often an alternative to major surgery, such as removal of the uterus and cervix (hysterectomy). As a result of this procedure:  You may not be able to have children. However, if you are premenopausal (you have not gone through menopause): ? You may still have a small chance of getting pregnant. ? You will need to use a reliable method of birth control after the procedure to prevent pregnancy.  You may stop having a menstrual period, or you may have only a small amount of bleeding during your period. Menstruation may return several years after the procedure.  Tell a health care provider about:  Any allergies you have.  All medicines you are taking, including vitamins, herbs, eye drops, creams, and over-the-counter medicines.  Any problems you or family members have had with the use of anesthetic medicines.  Any blood disorders you have.  Any surgeries you have had.  Any medical conditions you have. What are the risks? Generally, this is a safe procedure. However, problems may occur, including:  A hole (perforation) in the uterus or bowel.  Infection of the uterus, bladder, or vagina.  Bleeding.  Damage to other structures or organs.  An air bubble in the lung (air embolus).  Problems with pregnancy after the procedure.  Failure of the  procedure.  Decreased ability to diagnose cancer in the endometrium.  What happens before the procedure?  You will have tests of your endometrium to make sure there are no pre-cancerous cells or cancer cells present.  You may have an ultrasound of the uterus.  You may be given medicines to thin the endometrium.  Ask your health care provider about: ? Changing or stopping your regular medicines. This is especially important if you take diabetes medicines or blood thinners. ? Taking medicines such as aspirin and ibuprofen. These medicines can thin your blood. Do not take these medicines before your procedure if your doctor tells you not to.  Plan to have someone take you home from the hospital or clinic. What happens during the procedure?  You will lie on an exam table with your feet and legs supported as in a pelvic exam.  To lower your risk of infection: ? Your health care team will wash or sanitize their hands and put on germ-free (sterile) gloves. ? Your genital area will be washed with soap.  An IV tube will be inserted into one of your veins.  You will be given a medicine to help you relax (sedative).  A surgical instrument with a light and camera (resectoscope) will be inserted into your vagina and moved into your uterus. This allows your surgeon to see inside your uterus.  Endometrial tissue will be removed using one of the following methods: ? Radiofrequency. This method uses a radiofrequency-alternating electric current to remove the endometrium. ? Cryotherapy. This method uses extreme cold to freeze the endometrium. ? Heated-free liquid.  This method uses a heated saltwater (saline) solution to remove the endometrium. ? Microwave. This method uses high-energy microwaves to heat up the endometrium and remove it. ? Thermal balloon. This method involves inserting a catheter with a balloon tip into the uterus. The balloon tip is filled with heated fluid to remove the  endometrium. The procedure may vary among health care providers and hospitals. What happens after the procedure?  Your blood pressure, heart rate, breathing rate, and blood oxygen level will be monitored until the medicines you were given have worn off.  As tissue healing occurs, you may notice vaginal bleeding for 4-6 weeks after the procedure. You may also experience: ? Cramps. ? Thin, watery vaginal discharge that is light pink or brown in color. ? A need to urinate more frequently than usual. ? Nausea.  Do not drive for 24 hours if you were given a sedative.  Do not have sex or insert anything into your vagina until your health care provider approves. Summary  Endometrial ablation is done to treat the many causes of heavy menstrual bleeding.  The procedure may be done only after medications have been tried to control the bleeding.  Plan to have someone take you home from the hospital or clinic. This information is not intended to replace advice given to you by your health care provider. Make sure you discuss any questions you have with your health care provider. Document Released: 08/25/2004 Document Revised: 11/02/2016 Document Reviewed: 11/02/2016 Elsevier Interactive Patient Education  2017 [employer] Diagnostic Laparoscopy A diagnostic laparoscopy is a procedure to diagnose diseases in the abdomen. During the procedure, a thin, lighted, pencil-sized instrument called a laparoscope is inserted into the abdomen through an incision. The laparoscope allows your health care provider to look at the organs inside your body. Tell a health care provider about:  Any allergies you have.  All medicines you are taking, including vitamins, herbs, eye drops, creams, and over-the-counter medicines.  Any problems you or family members have had with anesthetic medicines.  Any blood disorders you have.  Any surgeries you have had.  Any medical conditions you have. What are the  risks? Generally, this is a safe procedure. However, problems can occur, which may include:  Infection.  Bleeding.  Damage to other organs.  Allergic reaction to the anesthetics used during the procedure.  What happens before the procedure?  Do not eat or drink anything after midnight on the night before the procedure or as directed by your health care provider.  Ask your health care provider about: ? Changing or stopping your regular medicines. ? Taking medicines such as aspirin and ibuprofen. These medicines can thin your blood. Do not take these medicines before your procedure if your health care provider instructs you not to.  Plan to have someone take you home after the procedure. What happens during the procedure?  You may be given a medicine to help you relax (sedative).  You will be given a medicine to make you sleep (general anesthetic).  Your abdomen will be inflated with a gas. This will make your organs easier to see.  Small incisions will be made in your abdomen.  A laparoscope and other small instruments will be inserted into the abdomen through the incisions.  A tissue sample may be removed from an organ in the abdomen for examination.  The instruments will be removed from the abdomen.  The gas will be released.  The incisions will be closed with stitches (sutures). What happens  after the procedure? Your blood pressure, heart rate, breathing rate, and blood oxygen level will be monitored often until the medicines you were given have worn off. This information is not intended to replace advice given to you by your health care provider. Make sure you discuss any questions you have with your health care provider. Document Released: 01/22/2001 Document Revised: 02/24/2016 Document Reviewed: 05/29/2014 Elsevier Interactive Patient Education  [employer].

## 2017-08-15 ENCOUNTER — Ambulatory Visit (INDEPENDENT_AMBULATORY_CARE_PROVIDER_SITE_OTHER): Payer: 59

## 2017-08-15 DIAGNOSIS — N946 Dysmenorrhea, unspecified: Secondary | ICD-10-CM | POA: Diagnosis not present

## 2017-08-15 DIAGNOSIS — N921 Excessive and frequent menstruation with irregular cycle: Secondary | ICD-10-CM

## 2017-08-15 LAB — TSH: TSH: 1.88 u[IU]/mL (ref 0.450–4.500)

## 2017-08-21 ENCOUNTER — Other Ambulatory Visit: Payer: Self-pay | Admitting: Family Medicine

## 2017-08-21 NOTE — Telephone Encounter (Signed)
TA-Last refill for this med was in August with note "Needs physical for further fills" she currently has GYN visit scheduled with Dr. Sharon Sellerefrancesco Martin on 10.31.2018/do you refill this or does he? Looks like he saw her early this month as well? Plz advise/thx dmf

## 2017-08-21 NOTE — Telephone Encounter (Signed)
Agree that GYN should be refilling this.  Thanks!

## 2017-08-22 NOTE — Telephone Encounter (Signed)
MD-Plz see pt req for BCP refill/thx dmf

## 2017-08-24 ENCOUNTER — Telehealth: Payer: Self-pay | Admitting: Family Medicine

## 2017-08-24 NOTE — Telephone Encounter (Signed)
GYN visit is scheduled for 10.31.2018/plz advise about refill/thx dmf

## 2017-08-24 NOTE — Telephone Encounter (Signed)
Copied from CRM [phone]. Topic: Quick Communication - See Telephone Encounter >> Aug 24, 2017  8:06 AM Kimberly Garner, Kimberly Garner wrote: CRM for notification. See Telephone encounter for: 08/24/17. Patient needs refill on her birth control pills.

## 2017-08-24 NOTE — Telephone Encounter (Signed)
She has requested a refill on her birth control pills however there is a note that she needs a physical before this can be refilled.

## 2017-08-27 MED ORDER — NORGESTIMATE-ETH ESTRADIOL 0.25-35 MG-MCG PO TABS: 1.0000 | ORAL_TABLET | Freq: Every day | ORAL | 0 refills | Status: DC

## 2017-08-27 NOTE — Addendum Note (Signed)
Addended by: Marchelle FolksMILLER, Jade Burright G on: 08/27/2017 11:58 AM   Modules accepted: Orders

## 2017-08-27 NOTE — Telephone Encounter (Signed)
Pt aware ocp refilled.

## 2017-08-29 ENCOUNTER — Ambulatory Visit (INDEPENDENT_AMBULATORY_CARE_PROVIDER_SITE_OTHER): Payer: 59 | Admitting: Obstetrics and Gynecology

## 2017-08-29 ENCOUNTER — Encounter: Payer: Self-pay | Admitting: Obstetrics and Gynecology

## 2017-08-29 VITALS — BP 118/72 | HR 86 | Ht 60.0 in | Wt 192.9 lb

## 2017-08-29 DIAGNOSIS — N946 Dysmenorrhea, unspecified: Secondary | ICD-10-CM | POA: Diagnosis not present

## 2017-08-29 DIAGNOSIS — Z9289 Personal history of other medical treatment: Secondary | ICD-10-CM | POA: Diagnosis not present

## 2017-08-29 DIAGNOSIS — N921 Excessive and frequent menstruation with irregular cycle: Secondary | ICD-10-CM | POA: Diagnosis not present

## 2017-08-29 NOTE — Progress Notes (Signed)
Chief complaint: 1.  Abnormal uterine bleeding 2.  Severe dysmenorrhea 3.  Follow-up on ultrasound  Patient presents for follow-up. Pelvic ultrasound 08/14/2017: ULTRASOUND REPORT  Location: [facility] Date of Service: 08/15/17   Indications:AUB Findings:  The uterus measures 8.1 x 4.9 x 4.8cm. Echo texture is homogeneous without evidence of focal masses. The Endometrium measures 4 mm.  Right Ovary measures 2.1 x 1.7 x 1.2 cm., and appears WNL. Left Ovary measures 3.0 x 1.9 x 1.7 cm., and appears WNL. Survey of the adnexa demonstrates no adnexal masses. There is a small amount of free fluid in the posterior cul de sac.  Impression: 1. Anteverted uterus appearing of normal size, shape, and contour. 2. Small amount of free fluid in posterior cul de sac  Recommendations: 1.Clinical correlation with the patient's History and Physical Exam.   Kari BaarsJill Long, RDMS Herold HarmsMartin A Jaxan Michel, MD  TSH-1.88 (08/15/2017) CBC-hemoglobin 11.7, hematocrit (06/01/2017)  SUBJECTIVE: Patient is on oral contraceptives.  She continues to have abnormal uterine bleeding despite use of the 25 mcg pill.  Cramps are still moderate when she is experiencing abnormal uterine bleeding.  Past medical history, past surgical history, problem list, medications, and allergies are reviewed  OBJECTIVE: BP 118/72   Pulse 86   Ht 5' (1.524 m)   Wt 192 lb 14.4 oz (87.5 kg)   LMP 08/23/2017 (Exact Date)   BMI 37.67 kg/m  Physical exam-deferred  ASSESSMENT: 1.  Severe dysmenorrhea 2.  Abnormal uterine bleeding, refractory to birth control pills 3.  Suspect endometriosis  PLAN: 1.  Continue with oral contraceptives 2.  Options of management of her suspected endometriosis were discussed including trial of Mirena IUD (declined) 3.  Surgery is scheduled for to rule out endometriosis as well as assess etiology for abnormal uterine bleeding-laparoscopy with peritoneal biopsy;  hysteroscopy/D&C 4.  Patient is to return for preop appointment prior to surgery.  A total of 15 minutes were spent face-to-face with the patient during this encounter and over half of that time dealt with counseling and coordination of care.  Herold HarmsMartin A Shamarra Warda, MD  Note: This dictation was prepared with Dragon dictation along with smaller phrase technology. Any transcriptional errors that result from this process are unintentional.

## 2017-08-29 NOTE — Patient Instructions (Signed)
1.  Continue with oral contraceptives 2.  Surgery is scheduled today-laparoscopy with peritoneal biopsies; and hysteroscopy/D&C 3.  Return 1 week before surgery for preop appointment  Diagnostic Laparoscopy A diagnostic laparoscopy is a procedure to diagnose diseases in the abdomen. During the procedure, a thin, lighted, pencil-sized instrument called a laparoscope is inserted into the abdomen through an incision. The laparoscope allows your health care provider to look at the organs inside your body. Tell a health care provider about:  Any allergies you have.  All medicines you are taking, including vitamins, herbs, eye drops, creams, and over-the-counter medicines.  Any problems you or family members have had with anesthetic medicines.  Any blood disorders you have.  Any surgeries you have had.  Any medical conditions you have. What are the risks? Generally, this is a safe procedure. However, problems can occur, which may include:  Infection.  Bleeding.  Damage to other organs.  Allergic reaction to the anesthetics used during the procedure.  What happens before the procedure?  Do not eat or drink anything after midnight on the night before the procedure or as directed by your health care provider.  Ask your health care provider about: ? Changing or stopping your regular medicines. ? Taking medicines such as aspirin and ibuprofen. These medicines can thin your blood. Do not take these medicines before your procedure if your health care provider instructs you not to.  Plan to have someone take you home after the procedure. What happens during the procedure?  You may be given a medicine to help you relax (sedative).  You will be given a medicine to make you sleep (general anesthetic).  Your abdomen will be inflated with a gas. This will make your organs easier to see.  Small incisions will be made in your abdomen.  A laparoscope and other small instruments will be  inserted into the abdomen through the incisions.  A tissue sample may be removed from an organ in the abdomen for examination.  The instruments will be removed from the abdomen.  The gas will be released.  The incisions will be closed with stitches (sutures). What happens after the procedure? Your blood pressure, heart rate, breathing rate, and blood oxygen level will be monitored often until the medicines you were given have worn off. This information is not intended to replace advice given to you by your health care provider. Make sure you discuss any questions you have with your health care provider. Document Released: 01/22/2001 Document Revised: 02/24/2016 Document Reviewed: 05/29/2014 Elsevier Interactive Patient Education  [employer]2018 Else[employer] Inc. Hysteroscopy Hysteroscopy is a procedure used for looking inside the womb (uterus). It may be done for various reasons, including:  To evaluate abnormal bleeding, fibroid (benign, noncancerous) tumors, polyps, scar tissue (adhesions), and possibly cancer of the uterus.  To look for lumps (tumors) and other uterine growths.  To look for causes of why a woman cannot get pregnant (infertility), causes of recurrent loss of pregnancy (miscarriages), or a lost intrauterine device (IUD).  To perform a sterilization by blocking the fallopian tubes from inside the uterus.  In this procedure, a thin, flexible tube with a tiny light and camera on the end of it (hysteroscope) is used to look inside the uterus. A hysteroscopy should be done right after a menstrual period to be sure you are not pregnant. LET [facility]ARE PROVIDER KNOW ABOUT:  Any allergies you have.  All medicines you are taking, including vitamins, herbs, eye drops, creams, and over-the-counter medicines.  Previous problems you or members of your family have had with the use of anesthetics.  Any blood disorders you have.  Previous surgeries you have had.  Medical conditions  you have. RISKS AND COMPLICATIONS Generally, this is a safe procedure. However, as with any procedure, complications can occur. Possible complications include:  Putting a hole in the uterus.  Excessive bleeding.  Infection.  Damage to the cervix.  Injury to other organs.  Allergic reaction to medicines.  Too much fluid used in the uterus for the procedure.  BEFORE THE PROCEDURE  Ask your health care provider about changing or stopping any regular medicines.  Do not take aspirin or blood thinners for 1 week before the procedure, or as directed by your health care provider. These can cause bleeding.  If you smoke, do not smoke for 2 weeks before the procedure.  In some cases, a medicine is placed in the cervix the day before the procedure. This medicine makes the cervix have a larger opening (dilate). This makes it easier for the instrument to be inserted into the uterus during the procedure.  Do not eat or drink anything for at least 8 hours before the surgery.  Arrange for someone to take you home after the procedure. PROCEDURE  You may be given a medicine to relax you (sedative). You may also be given one of the following: ? A medicine that numbs the area around the cervix (local anesthetic). ? A medicine that makes you sleep through the procedure (general anesthetic).  The hysteroscope is inserted through the vagina into the uterus. The camera on the hysteroscope sends a picture to a TV screen. This gives the surgeon a good view inside the uterus.  During the procedure, air or a liquid is put into the uterus, which allows the surgeon to see better.  Sometimes, tissue is gently scraped from inside the uterus. These tissue samples are sent to a lab for testing. What to expect after the procedure  If you had a general anesthetic, you may be groggy for a couple hours after the procedure.  If you had a local anesthetic, you will be able to go home as soon as you are stable  and feel ready.  You may have some cramping. This normally lasts for a couple days.  You may have bleeding, which varies from light spotting for a few days to menstrual-like bleeding for 3-7 days. This is normal.  If your test results are not back during the visit, make an appointment with your health care provider to find out the results. This information is not intended to replace advice given to you by your health care provider. Make sure you discuss any questions you have with your health care provider. Document Released: 01/22/2001 Document Revised: 03/23/2016 Document Reviewed: 05/15/2013 Elsevier Interactive Patient Education  2017 Elsevier Inc.  Dilation and Curettage or Vacuum Curettage Dilation and curettage (D&C) and vacuum curettage are minor procedures. A D&C involves stretching (dilation) the cervix and scraping (curettage) the inside lining of the uterus (endometrium). During a D&C, tissue is gently scraped from the endometrium, starting from the top portion of the uterus down to the lowest part of the uterus (cervix). During a vacuum curettage, the lining and tissue in the uterus are removed with the use of gentle suction. Curettage may be performed to either diagnose or treat a problem. As a diagnostic procedure, curettage is performed to examine tissues from the uterus. A diagnostic curettage may be done if you have:  Irregular bleeding in the uterus.  Bleeding with the development of clots.  Spotting between menstrual periods.  Prolonged menstrual periods or other abnormal bleeding.  Bleeding after menopause.  No menstrual period (amenorrhea).  A change in size and shape of the uterus.  Abnormal endometrial cells discovered during a Pap test.  As a treatment procedure, curettage may be performed for the following reasons:  Removal of an IUD (intrauterine device).  Removal of retained placenta after giving birth.  Abortion.  Miscarriage.  Removal of  endometrial polyps.  Removal of uncommon types of noncancerous lumps (fibroids).  Tell a health care provider about:  Any allergies you have, including allergies to prescribed medicine or latex.  All medicines you are taking, including vitamins, herbs, eye drops, creams, and over-the-counter medicines. This is especially important if you take any blood-thinning medicine. Bring a list of all of your medicines to your appointment.  Any problems you or family members have had with anesthetic medicines.  Any blood disorders you have.  Any surgeries you have had.  Your medical history and any medical conditions you have.  Whether you are pregnant or may be pregnant.  Recent vaginal infections you have had.  Recent menstrual periods, bleeding problems you have had, and what form of birth control (contraception) you use. What are the risks? Generally, this is a safe procedure. However, problems may occur, including:  Infection.  Heavy vaginal bleeding.  Allergic reactions to medicines.  Damage to the cervix or other structures or organs.  Development of scar tissue (adhesions) inside the uterus, which can cause abnormal amounts of menstrual bleeding. This may make it harder to get pregnant in the future.  A hole (perforation) or puncture in the uterine wall. This is rare.  What happens before the procedure? Staying hydrated Follow instructions from your health care provider about hydration, which may include:  Up to 2 hours before the procedure - you may continue to drink clear liquids, such as water, clear fruit juice, black coffee, and plain tea.  Eating and drinking restrictions Follow instructions from your health care provider about eating and drinking, which may include:  8 hours before the procedure - stop eating heavy meals or foods such as meat, fried foods, or fatty foods.  6 hours before the procedure - stop eating light meals or foods, such as toast or  cereal.  6 hours before the procedure - stop drinking milk or drinks that contain milk.  2 hours before the procedure - stop drinking clear liquids. If your health care provider told you to take your medicine(s) on the day of your procedure, take them with only a sip of water.  Medicines  Ask your health care provider about: ? Changing or stopping your regular medicines. This is especially important if you are taking diabetes medicines or blood thinners. ? Taking medicines such as aspirin and ibuprofen. These medicines can thin your blood. Do not take these medicines before your procedure if your health care provider instructs you not to.  You may be given antibiotic medicine to help prevent infection. General instructions  For 24 hours before your procedure, do not: ? Douche. ? Use tampons. ? Use medicines, creams, or suppositories in the vagina. ? Have sexual intercourse.  You may be given a pregnancy test on the day of the procedure.  Plan to have someone take you home from the hospital or clinic.  You may have a blood or urine sample taken.  If you will be going  home right after the procedure, plan to have someone with you for 24 hours. What happens during the procedure?  To reduce your risk of infection: ? Your health care team will wash or sanitize their hands. ? Your skin will be washed with soap.  An IV tube will be inserted into one of your veins.  You will be given one of the following: ? A medicine that numbs the area in and around the cervix (local anesthetic). ? A medicine to make you fall asleep (general anesthetic).  You will lie down on your back, with your feet in foot rests (stirrups).  The size and position of your uterus will be checked.  A lubricated instrument (speculum or Sims retractor) will be inserted into the back side of your vagina. The speculum will be used to hold apart the walls of your vagina so your health care provider can see your  cervix.  A tool (tenaculum) will be attached to the lip of the cervix to stabilize it.  Your cervix will be softened and dilated. This may be done by: ? Taking a medicine. ? Having tapered dilators or thin rods (laminaria) or gradual widening instruments (tapered dilators) inserted into your cervix.  A small, sharp, curved instrument (curette) will be used to scrape a small amount of tissue or cells from the endometrium or cervical canal. In some cases, gentle suction is applied with the curette. The curette will then be removed. The cells will be taken to a lab for testing. The procedure may vary among health care providers and hospitals. What happens after the procedure?  You may have mild cramping, backache, pain, and light bleeding or spotting. You may pass small blood clots from your vagina.  You may have to wear compression stockings. These stockings help to prevent blood clots and reduce swelling in your legs.  Your blood pressure, heart rate, breathing rate, and blood oxygen level will be monitored until the medicines you were given have worn off. Summary  Dilation and curettage (D&C) involves stretching (dilation) the cervix and scraping (curettage) the inside lining of the uterus (endometrium).  After the procedure, you may have mild cramping, backache, pain, and light bleeding or spotting. You may pass small blood clots from your vagina.  Plan to have someone take you home from the hospital or clinic. This information is not intended to replace advice given to you by your health care provider. Make sure you discuss any questions you have with your health care provider. Document Released: 10/16/2005 Document Revised: 07/02/2016 Document Reviewed: 07/02/2016 Elsevier Interactive Patient Education  2018 [employer].

## 2017-08-30 DIAGNOSIS — M545 Low back pain: Secondary | ICD-10-CM | POA: Diagnosis not present

## 2017-08-30 DIAGNOSIS — M9901 Segmental and somatic dysfunction of cervical region: Secondary | ICD-10-CM | POA: Diagnosis not present

## 2017-08-30 DIAGNOSIS — M9903 Segmental and somatic dysfunction of lumbar region: Secondary | ICD-10-CM | POA: Diagnosis not present

## 2017-09-13 DIAGNOSIS — M9901 Segmental and somatic dysfunction of cervical region: Secondary | ICD-10-CM | POA: Diagnosis not present

## 2017-09-13 DIAGNOSIS — M9903 Segmental and somatic dysfunction of lumbar region: Secondary | ICD-10-CM | POA: Diagnosis not present

## 2017-09-13 DIAGNOSIS — M545 Low back pain: Secondary | ICD-10-CM | POA: Diagnosis not present

## 2017-09-19 ENCOUNTER — Encounter
Admission: RE | Admit: 2017-09-19 | Discharge: 2017-09-19 | Disposition: A | Discharge status: Home / Self Care | Payer: 59 | Source: Ambulatory Visit | Attending: Obstetrics and Gynecology | Admitting: Obstetrics and Gynecology

## 2017-09-19 ENCOUNTER — Other Ambulatory Visit: Payer: Self-pay

## 2017-09-19 ENCOUNTER — Encounter: Payer: Self-pay | Admitting: Obstetrics and Gynecology

## 2017-09-19 ENCOUNTER — Ambulatory Visit (INDEPENDENT_AMBULATORY_CARE_PROVIDER_SITE_OTHER): Payer: 59 | Admitting: Obstetrics and Gynecology

## 2017-09-19 VITALS — BP 111/74 | HR 85 | Ht 60.0 in | Wt 195.2 lb

## 2017-09-19 DIAGNOSIS — N946 Dysmenorrhea, unspecified: Secondary | ICD-10-CM

## 2017-09-19 DIAGNOSIS — N921 Excessive and frequent menstruation with irregular cycle: Secondary | ICD-10-CM

## 2017-09-19 DIAGNOSIS — Z01818 Encounter for other preprocedural examination: Secondary | ICD-10-CM

## 2017-09-19 DIAGNOSIS — Z9289 Personal history of other medical treatment: Secondary | ICD-10-CM

## 2017-09-19 HISTORY — DX: Family history of other specified conditions: Z84.89

## 2017-09-19 HISTORY — DX: Hypothyroidism, unspecified: E03.9

## 2017-09-19 HISTORY — DX: Headache: R51

## 2017-09-19 HISTORY — DX: Headache, unspecified: R51.9

## 2017-09-19 NOTE — Patient Instructions (Addendum)
  Your procedure is scheduled on: 09-24-17 Report to Same Day Surgery 2nd floor medical mall [facility](Medical Mall Entrance-take elevator on left to 2nd floor.  Check in with surgery information desk.) To find out your arrival time please call [phone] between 1PM - 3PM on 09-21-17  Remember: Instructions that are not followed completely may result in serious medical risk, up to and including death, or upon the discretion of your surgeon and anesthesiologist your surgery may need to be rescheduled.    _x___ 1. Do not eat food after midnight the night before your procedure. You may drink clear liquids up to 2 hours before you are scheduled to arrive at the hospital for your procedure.  Do not drink clear liquids within 2 hours of your scheduled arrival to the hospital.  Clear liquids include  --Water or Apple juice without pulp  --Clear carbohydrate beverage such as ClearFast or Gatorade  --Black Coffee or Clear Tea (No milk, no creamers, do not add anything to  the coffee or Tea   No gum chewing or hard candies.     __x__ 2. No Alcohol for 24 hours before or after surgery.   __x__3. No Smoking for 24 prior to surgery.   ____  4. Bring all medications with you on the day of surgery if instructed.    __x__ 5. Notify your doctor if there is any change in your medical condition     (cold, fever, infections).     Do not wear jewelry, make-up, hairpins, clips or nail polish.  Do not wear lotions, powders, or perfumes. You may wear deodorant.  Do not shave 48 hours prior to surgery. Men may shave face and neck.  Do not bring valuables to the hospital.    [facility] not responsible for any belongings or valuables.               Contacts, dentures or bridgework may not be worn into surgery.  Leave your suitcase in the car. After surgery it may be brought to your room.  For patients admitted to the hospital, discharge time is determined by your treatment team.   Patients discharged the day  of surgery will not be allowed to drive home.  You will need someone to drive you home and stay with you the night of your procedure.      _x___ TAKE THE FOLLOWING MEDICATION THE MORNING OF SURGERY WITH A SMALL SIP OF WATER. These include:  1. LEVOTHYROXINE  2.  3.  4.  5.  6.  ____Fleets enema or Magnesium Citrate as directed.   ____ Use CHG Soap or sage wipes as directed on instruction sheet   ____ Use inhalers on the day of surgery and bring to hospital day of surgery  ____ Stop Metformin and Janumet 2 days prior to surgery.    ____ Take 1/2 of usual insulin dose the night before surgery and none on the morning surgery.   ____ Follow recommendations from Cardiologist, Pulmonologist or PCP regarding  stopping Aspirin, Coumadin, Plavix ,Eliquis, Effient, or Pradaxa, and Pletal.  X____Stop Anti-inflammatories such as Advil, Aleve, IBURPOFEN, Motrin, Naproxen, Naprosyn, Goodies powders or aspirin products NOW-OK to take Tylenol    ____ Stop supplements until after surgery.     ____ Bring C-Pap to the hospital.

## 2017-09-19 NOTE — Patient Instructions (Signed)
1.  Return 1 week after surgery for postop check

## 2017-09-19 NOTE — H&P (Addendum)
Subjective: PREOPERATIVE HISTORY AND PHYSICAL  Date of surgery: 09/24/2017 Procedures: 1.  Hysteroscopy/D&C 2.  Laparoscopy with peritoneal biopsies 3.  Bilateral salpingectomy  Diagnosis: 1.  Abnormal uterine bleeding refractory to medical therapy 2.  Severe dysmenorrhea 3.  Desires sterilization   Patient is a 31 y.o. [id number] scheduled for surgery on 09/24/2017.  The patient has significant health issues including: 1. Abnormal uterine bleeding 2. History of blood transfusion 3. Severe dysmenorrhea 4.  Desires sterilization  31 year old married female para 1001, currently on birth control pills for menstrual cycle regulation and control of menorrhagia and dysmenorrhea. History of blood transfusion for menorrhagia in 2010 Menarche-age 89 Intervals-1-4 weeks Duration of flow 7-14 days History of severe dysmenorrhea, 10/10 in intensity; dysmenorrhea notable for bilateral pelvic cramping along with low back pain with radiation into the buttocks; patient has missed work and school because of dysmenorrhea No history of persistent deep thrusting dyspareunia No history of abnormal Pap smears No history of STDs. Family history of severe menstrual cramps without diagnosis of endometriosis in sisters and mom OCPs not regulating menses adequately. Insurance seems to change pill brand every 3 months.   08/15/2017 ultrasound: ULTRASOUND REPORT  Location: [facility] Date of Service: 08/15/17   Indications:AUB Findings:  The uterus measures 8.1 x 4.9 x 4.8cm. Echo texture is homogeneous without evidence of focal masses. The Endometrium measures 4 mm.  Right Ovary measures 2.1 x 1.7 x 1.2 cm., and appears WNL. Left Ovary measures 3.0 x 1.9 x 1.7 cm., and appears WNL. Survey of the adnexa demonstrates no adnexal masses. There is a small amount of free fluid in the posterior cul de sac.  Impression: 1. Anteverted uterus appearing of normal size, shape, and  contour. 2. Small amount of free fluid in posterior cul de sac  Recommendations: 1.Clinical correlation with the patient's History and Physical Exam.   Kari BaarsJill Long, RDMS Herold HarmsMartin A Juno Bozard, MD  OB History    Gravida Para Term Preterm AB Living   1 1 1     1    SAB TAB Ectopic Multiple Live Births           1      Obstetric Comments   1st Menstrual Cycle:  11 1st Pregnancy:  26     Patient's last menstrual period was 08/23/2017 (exact date).    Past Medical History:  Diagnosis Date  . Anemia    iron deficiency  . Breast lump 2014  . Thyroid disease     Past Surgical History:  Procedure Laterality Date  . CESAREAN SECTION  2014  . CHOLECYSTECTOMY  2012  . FETAL BLOOD TRANSFUSION  2010  . FOOT SURGERY  2013    OB History  Gravida Para Term Preterm AB Living  1 1 1     1   SAB TAB Ectopic Multiple Live Births          1    # Outcome Date GA Lbr Len/2nd Weight Sex Delivery Anes PTL Lv  1 Term 2014   8 lb 2.4 oz (3.697 kg) M CS-LTranv   LIV    Obstetric Comments  1st Menstrual Cycle:  11  1st Pregnancy:  89    Social History   Socioeconomic History  . Marital status: Married    Spouse name: Not on file  . Number of children: Not on file  . Years of education: Not on file  . Highest education level: Not on file  Social Needs  . Financial resource strain:  Not on file  . Food insecurity - worry: Not on file  . Food insecurity - inability: Not on file  . Transportation needs - medical: Not on file  . Transportation needs - non-medical: Not on file  Occupational History  . Not on file  Tobacco Use  . Smoking status: Never Smoker  . Smokeless tobacco: Never Used  Substance and Sexual Activity  . Alcohol use: No  . Drug use: No  . Sexual activity: Yes    Birth control/protection: Pill  Other Topics Concern  . 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 Hypertension Mother   . Diabetes Mother   . Breast cancer Neg Hx   . Ovarian cancer Neg Hx   . Colon cancer Neg Hx      (Not in a hospital admission)  No Known Allergies  Review of Systems Constitutional: No recent fever/chills/sweats Respiratory: No recent cough/bronchitis Cardiovascular: No chest pain Gastrointestinal: No recent nausea/vomiting/diarrhea Genitourinary: No UTI symptoms Hematologic/lymphatic:No history of coagulopathy or recent blood thinner use    Objective:    LMP 08/23/2017 (Exact Date)   General:   Normal  Skin:   normal  HEENT:  Normal  Neck:  Supple without Adenopathy or Thyromegaly  Lungs:   Heart:              Breasts:   Abdomen:  Pelvis:  M/S   Extremeties:  Neuro:    clear to auscultation bilaterally   Normal without murmur   Not Examined   soft, non-tender; bowel sounds normal; no masses,  no organomegaly   Exam deferred to OR  No CVAT  Warm/Dry   Normal        08/14/2017 PELVIC:             External Genitalia: Normal             BUS: Normal             Vagina: Normal             Cervix: Normal; no lesions; no cervical motion tenderness             Uterus: midplane, 1/4 tender, normal size and shape andconsistency, mobile             Adnexa: Normal; nonpalpable nontender             RV: Normal external exam             Bladder: Nontender    Assessment:    1.  Abnormal uterine bleeding refractory to medical therapy 2.  Severe dysmenorrhea 3.  History of blood transfusion 4.  Suspected endometriosis 5.  Desires sterilization   Plan:  1.  Hysteroscopy/D&C 2.  Laparoscopy with perineal biopsies 3.  Bilateral salpingectomy  Counseling: Patient is to undergo hysteroscopy/D&C and laparoscopy with peritoneal biopsies on 09/24/2017.  She is understanding of the planned procedures and is aware but is accepting of all surgical risks which include but are not limited to bleeding, infection, pelvic organ injury with  need for repair, blood clot disorders, anesthesia risk, etc.  All questions have been answered.  Informed consent is given.  Patient is willing to proceed with surgery as scheduled.  Addendum: The patient also wishes to have her tubal ligation for prevention of future pregnancy.  The bilateral fallopian tubes will be removed.  She is accepting of the additional risks of the surgery including failure rate of 1 out of 300   Herold Harms, MD  Note: This dictation was prepared with Dragon dictation along with smaller phrase technology. Any transcriptional errors that result from this process are unintentional.

## 2017-09-19 NOTE — Progress Notes (Signed)
Subjective: PREOPERATIVE HISTORY AND PHYSICAL  Date of surgery: 09/24/2017 Procedures: 1.  Hysteroscopy/D&C 2.  Laparoscopy with peritoneal biopsies Diagnosis: 1.  Abnormal uterine bleeding refractory to medical therapy 2.  Severe dysmenorrhea   Patient is a 31 y.o. [id number] scheduled for surgery on 09/24/2017.  The patient has significant health issues including: 1. Abnormal uterine bleeding 2. History of blood transfusion 3. Severe dysmenorrhea 4.  Desires sterilization  31 year old married female para 1001, currently on birth control pills for menstrual cycle regulation and control of menorrhagia and dysmenorrhea. History of blood transfusion for menorrhagia in 2010 Menarche-age 89 Intervals-1-4 weeks Duration of flow 7-14 days History of severe dysmenorrhea, 10/10 in intensity; dysmenorrhea notable for bilateral pelvic cramping along with low back pain with radiation into the buttocks; patient has missed work and school because of dysmenorrhea No history of persistent deep thrusting dyspareunia No history of abnormal Pap smears No history of STDs. Family history of severe menstrual cramps without diagnosis of endometriosis in sisters and mom OCPs not regulating menses adequately. Insurance seems to change pill brand every 3 months.   08/15/2017 ultrasound: ULTRASOUND REPORT  Location: [facility] Date of Service: 08/15/17   Indications:AUB Findings:  The uterus measures 8.1 x 4.9 x 4.8cm. Echo texture is homogeneous without evidence of focal masses. The Endometrium measures 4 mm.  Right Ovary measures 2.1 x 1.7 x 1.2 cm., and appears WNL. Left Ovary measures 3.0 x 1.9 x 1.7 cm., and appears WNL. Survey of the adnexa demonstrates no adnexal masses. There is a small amount of free fluid in the posterior cul de sac.  Impression: 1. Anteverted uterus appearing of normal size, shape, and contour. 2. Small amount of free fluid in posterior cul de  sac  Recommendations: 1.Clinical correlation with the patient's History and Physical Exam.   Kari BaarsJill Long, RDMS Herold HarmsMartin A Eon Zunker, MD  OB History    Gravida Para Term Preterm AB Living   1 1 1     1    SAB TAB Ectopic Multiple Live Births           1      Obstetric Comments   1st Menstrual Cycle:  11 1st Pregnancy:  26     Patient's last menstrual period was 08/23/2017 (exact date).    Past Medical History:  Diagnosis Date  . Anemia    iron deficiency  . Breast lump 2014  . Thyroid disease     Past Surgical History:  Procedure Laterality Date  . CESAREAN SECTION  2014  . CHOLECYSTECTOMY  2012  . FETAL BLOOD TRANSFUSION  2010  . FOOT SURGERY  2013    OB History  Gravida Para Term Preterm AB Living  1 1 1     1   SAB TAB Ectopic Multiple Live Births          1    # Outcome Date GA Lbr Len/2nd Weight Sex Delivery Anes PTL Lv  1 Term 2014   8 lb 2.4 oz (3.697 kg) M CS-LTranv   LIV    Obstetric Comments  1st Menstrual Cycle:  11  1st Pregnancy:  1726    Social History   Socioeconomic History  . Marital status: Married    Spouse name: Not on file  . Number of children: Not on file  . Years of education: Not on file  . Highest education level: Not on file  Social Needs  . Financial resource strain: Not on file  . Food insecurity - worry:  Not on file  . Food insecurity - inability: Not on file  . Transportation needs - medical: Not on file  . Transportation needs - non-medical: Not on file  Occupational History  . Not on file  Tobacco Use  . Smoking status: Never Smoker  . Smokeless tobacco: Never Used  Substance and Sexual Activity  . Alcohol use: No  . Drug use: No  . Sexual activity: Yes    Birth control/protection: Pill  Other Topics Concern  . 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 Hypertension Mother   . Diabetes Mother   .  Breast cancer Neg Hx   . Ovarian cancer Neg Hx   . Colon cancer Neg Hx      (Not in a hospital admission)  No Known Allergies  Review of Systems Constitutional: No recent fever/chills/sweats Respiratory: No recent cough/bronchitis Cardiovascular: No chest pain Gastrointestinal: No recent nausea/vomiting/diarrhea Genitourinary: No UTI symptoms Hematologic/lymphatic:No history of coagulopathy or recent blood thinner use    Objective:    LMP 08/23/2017 (Exact Date)   General:   Normal  Skin:   normal  HEENT:  Normal  Neck:  Supple without Adenopathy or Thyromegaly  Lungs:   Heart:              Breasts:   Abdomen:  Pelvis:  M/S   Extremeties:  Neuro:    clear to auscultation bilaterally   Normal without murmur   Not Examined   soft, non-tender; bowel sounds normal; no masses,  no organomegaly   Exam deferred to OR  No CVAT  Warm/Dry   Normal        08/14/2017 PELVIC:             External Genitalia: Normal             BUS: Normal             Vagina: Normal             Cervix: Normal; no lesions; no cervical motion tenderness             Uterus: midplane, 1/4 tender, normal size and shape andconsistency, mobile             Adnexa: Normal; nonpalpable nontender             RV: Normal external exam             Bladder: Nontender    Assessment:    1.  Abnormal uterine bleeding refractory to medical therapy 2.  Severe dysmenorrhea 3.  History of blood transfusion 4.  Suspected endometriosis 5.  Desires sterilization   Plan:  1.  Hysteroscopy/D&C 2.  Laparoscopy with perineal biopsies 3.  Bilateral salpingectomy  Counseling: Patient is to undergo hysteroscopy/D&C and laparoscopy with peritoneal biopsies on 09/24/2017.  She is understanding of the planned procedures and is aware but is accepting of all surgical risks which include but are not limited to bleeding, infection, pelvic organ injury with need for repair, blood clot disorders, anesthesia risk, etc.   All questions have been answered.  Informed consent is given.  Patient is willing to proceed with surgery as scheduled.  Addendum: The patient also wishes to have her tubal ligation for prevention of future pregnancy.  The bilateral fallopian tubes will be removed.  She is accepting of the additional risks of  the surgery including failure rate of 1 out of 300  Herold Harms, MD  Note: This dictation was prepared with Dragon dictation along with smaller phrase technology. Any transcriptional errors that result from this process are unintentional.

## 2017-09-19 NOTE — H&P (View-Only) (Signed)
Subjective: PREOPERATIVE HISTORY AND PHYSICAL  Date of surgery: 09/24/2017 Procedures: 1.  Hysteroscopy/D&C 2.  Laparoscopy with peritoneal biopsies 3.  Bilateral salpingectomy  Diagnosis: 1.  Abnormal uterine bleeding refractory to medical therapy 2.  Severe dysmenorrhea 3.  Desires sterilization   Patient is a 31 y.o. [id number] scheduled for surgery on 09/24/2017.  The patient has significant health issues including: 1. Abnormal uterine bleeding 2. History of blood transfusion 3. Severe dysmenorrhea 4.  Desires sterilization  31 year old married female para 1001, currently on birth control pills for menstrual cycle regulation and control of menorrhagia and dysmenorrhea. History of blood transfusion for menorrhagia in 2010 Menarche-age 89 Intervals-1-4 weeks Duration of flow 7-14 days History of severe dysmenorrhea, 10/10 in intensity; dysmenorrhea notable for bilateral pelvic cramping along with low back pain with radiation into the buttocks; patient has missed work and school because of dysmenorrhea No history of persistent deep thrusting dyspareunia No history of abnormal Pap smears No history of STDs. Family history of severe menstrual cramps without diagnosis of endometriosis in sisters and mom OCPs not regulating menses adequately. Insurance seems to change pill brand every 3 months.   08/15/2017 ultrasound: ULTRASOUND REPORT  Location: [facility] Date of Service: 08/15/17   Indications:AUB Findings:  The uterus measures 8.1 x 4.9 x 4.8cm. Echo texture is homogeneous without evidence of focal masses. The Endometrium measures 4 mm.  Right Ovary measures 2.1 x 1.7 x 1.2 cm., and appears WNL. Left Ovary measures 3.0 x 1.9 x 1.7 cm., and appears WNL. Survey of the adnexa demonstrates no adnexal masses. There is a small amount of free fluid in the posterior cul de sac.  Impression: 1. Anteverted uterus appearing of normal size, shape, and  contour. 2. Small amount of free fluid in posterior cul de sac  Recommendations: 1.Clinical correlation with the patient's History and Physical Exam.   Kari BaarsJill Long, RDMS Herold HarmsMartin A Wyoma Genson, MD  OB History    Gravida Para Term Preterm AB Living   1 1 1     1    SAB TAB Ectopic Multiple Live Births           1      Obstetric Comments   1st Menstrual Cycle:  11 1st Pregnancy:  26     Patient's last menstrual period was 08/23/2017 (exact date).    Past Medical History:  Diagnosis Date  . Anemia    iron deficiency  . Breast lump 2014  . Thyroid disease     Past Surgical History:  Procedure Laterality Date  . CESAREAN SECTION  2014  . CHOLECYSTECTOMY  2012  . FETAL BLOOD TRANSFUSION  2010  . FOOT SURGERY  2013    OB History  Gravida Para Term Preterm AB Living  1 1 1     1   SAB TAB Ectopic Multiple Live Births          1    # Outcome Date GA Lbr Len/2nd Weight Sex Delivery Anes PTL Lv  1 Term 2014   8 lb 2.4 oz (3.697 kg) M CS-LTranv   LIV    Obstetric Comments  1st Menstrual Cycle:  11  1st Pregnancy:  89    Social History   Socioeconomic History  . Marital status: Married    Spouse name: Not on file  . Number of children: Not on file  . Years of education: Not on file  . Highest education level: Not on file  Social Needs  . Financial resource strain:  Not on file  . Food insecurity - worry: Not on file  . Food insecurity - inability: Not on file  . Transportation needs - medical: Not on file  . Transportation needs - non-medical: Not on file  Occupational History  . Not on file  Tobacco Use  . Smoking status: Never Smoker  . Smokeless tobacco: Never Used  Substance and Sexual Activity  . Alcohol use: No  . Drug use: No  . Sexual activity: Yes    Birth control/protection: Pill  Other Topics Concern  . 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 Hypertension Mother   . Diabetes Mother   . Breast cancer Neg Hx   . Ovarian cancer Neg Hx   . Colon cancer Neg Hx      (Not in a hospital admission)  No Known Allergies  Review of Systems Constitutional: No recent fever/chills/sweats Respiratory: No recent cough/bronchitis Cardiovascular: No chest pain Gastrointestinal: No recent nausea/vomiting/diarrhea Genitourinary: No UTI symptoms Hematologic/lymphatic:No history of coagulopathy or recent blood thinner use    Objective:    LMP 08/23/2017 (Exact Date)   General:   Normal  Skin:   normal  HEENT:  Normal  Neck:  Supple without Adenopathy or Thyromegaly  Lungs:   Heart:              Breasts:   Abdomen:  Pelvis:  M/S   Extremeties:  Neuro:    clear to auscultation bilaterally   Normal without murmur   Not Examined   soft, non-tender; bowel sounds normal; no masses,  no organomegaly   Exam deferred to OR  No CVAT  Warm/Dry   Normal        08/14/2017 PELVIC:             External Genitalia: Normal             BUS: Normal             Vagina: Normal             Cervix: Normal; no lesions; no cervical motion tenderness             Uterus: midplane, 1/4 tender, normal size and shape andconsistency, mobile             Adnexa: Normal; nonpalpable nontender             RV: Normal external exam             Bladder: Nontender    Assessment:    1.  Abnormal uterine bleeding refractory to medical therapy 2.  Severe dysmenorrhea 3.  History of blood transfusion 4.  Suspected endometriosis 5.  Desires sterilization   Plan:  1.  Hysteroscopy/D&C 2.  Laparoscopy with perineal biopsies 3.  Bilateral salpingectomy  Counseling: Patient is to undergo hysteroscopy/D&C and laparoscopy with peritoneal biopsies on 09/24/2017.  She is understanding of the planned procedures and is aware but is accepting of all surgical risks which include but are not limited to bleeding, infection, pelvic organ injury with  need for repair, blood clot disorders, anesthesia risk, etc.  All questions have been answered.  Informed consent is given.  Patient is willing to proceed with surgery as scheduled.  Addendum: The patient also wishes to have her tubal ligation for prevention of future pregnancy.  The bilateral fallopian tubes will be removed.  She is accepting of the additional risks of the surgery including failure rate of 1 out of 300   Herold Harms, MD  Note: This dictation was prepared with Dragon dictation along with smaller phrase technology. Any transcriptional errors that result from this process are unintentional.

## 2017-09-24 ENCOUNTER — Ambulatory Visit: Payer: Commercial Managed Care - HMO | Admitting: Anesthesiology

## 2017-09-24 ENCOUNTER — Other Ambulatory Visit: Payer: Self-pay

## 2017-09-24 ENCOUNTER — Encounter
Admission: RE | Disposition: A | Discharge status: Home / Self Care | Payer: Self-pay | Source: Ambulatory Visit | Attending: Obstetrics and Gynecology

## 2017-09-24 ENCOUNTER — Ambulatory Visit
Admission: RE | Admit: 2017-09-24 | Discharge: 2017-09-24 | Disposition: A | Discharge status: Home / Self Care | Payer: Commercial Managed Care - HMO | Source: Ambulatory Visit | Attending: Obstetrics and Gynecology | Admitting: Obstetrics and Gynecology

## 2017-09-24 DIAGNOSIS — N946 Dysmenorrhea, unspecified: Secondary | ICD-10-CM | POA: Insufficient documentation

## 2017-09-24 DIAGNOSIS — N3289 Other specified disorders of bladder: Secondary | ICD-10-CM | POA: Diagnosis not present

## 2017-09-24 DIAGNOSIS — N736 Female pelvic peritoneal adhesions (postinfective): Secondary | ICD-10-CM | POA: Diagnosis not present

## 2017-09-24 DIAGNOSIS — N921 Excessive and frequent menstruation with irregular cycle: Secondary | ICD-10-CM

## 2017-09-24 DIAGNOSIS — N809 Endometriosis, unspecified: Secondary | ICD-10-CM

## 2017-09-24 DIAGNOSIS — N838 Other noninflammatory disorders of ovary, fallopian tube and broad ligament: Secondary | ICD-10-CM | POA: Diagnosis not present

## 2017-09-24 DIAGNOSIS — N939 Abnormal uterine and vaginal bleeding, unspecified: Secondary | ICD-10-CM | POA: Diagnosis present

## 2017-09-24 DIAGNOSIS — K660 Peritoneal adhesions (postprocedural) (postinfection): Secondary | ICD-10-CM | POA: Diagnosis not present

## 2017-09-24 DIAGNOSIS — N803 Endometriosis of pelvic peritoneum: Secondary | ICD-10-CM | POA: Insufficient documentation

## 2017-09-24 DIAGNOSIS — N920 Excessive and frequent menstruation with regular cycle: Secondary | ICD-10-CM | POA: Insufficient documentation

## 2017-09-24 DIAGNOSIS — Z302 Encounter for sterilization: Secondary | ICD-10-CM | POA: Insufficient documentation

## 2017-09-24 DIAGNOSIS — Z9889 Other specified postprocedural states: Secondary | ICD-10-CM

## 2017-09-24 HISTORY — DX: Endometriosis, unspecified: N80.9

## 2017-09-24 HISTORY — DX: Other specified postprocedural states: Z98.890

## 2017-09-24 HISTORY — PX: HYSTEROSCOPY WITH D & C: SHX1775

## 2017-09-24 HISTORY — PX: LAPAROSCOPIC TUBAL LIGATION: SHX1937

## 2017-09-24 HISTORY — PX: LAPAROSCOPY: SHX197

## 2017-09-24 LAB — CBC WITH DIFFERENTIAL/PLATELET
Basophils Absolute: 0 10*3/uL (ref 0–0.1)
Basophils Relative: 1 %
Eosinophils Absolute: 0.1 10*3/uL (ref 0–0.7)
Eosinophils Relative: 2 %
HCT: 41.3 % (ref 35.0–47.0)
Hemoglobin: 14.1 g/dL (ref 12.0–16.0)
Lymphocytes Relative: 33 %
Lymphs Abs: 1.8 10*3/uL (ref 1.0–3.6)
MCH: 31 pg (ref 26.0–34.0)
MCHC: 34.2 g/dL (ref 32.0–36.0)
MCV: 90.6 fL (ref 80.0–100.0)
Monocytes Absolute: 0.4 10*3/uL (ref 0.2–0.9)
Monocytes Relative: 7 %
Neutro Abs: 3.1 10*3/uL (ref 1.4–6.5)
Neutrophils Relative %: 57 %
Platelets: 218 10*3/uL (ref 150–440)
RBC: 4.56 MIL/uL (ref 3.80–5.20)
RDW: 13.1 % (ref 11.5–14.5)
WBC: 5.4 10*3/uL (ref 3.6–11.0)

## 2017-09-24 LAB — POCT PREGNANCY, URINE: PREG TEST UR: NEGATIVE

## 2017-09-24 LAB — TYPE AND SCREEN
ABO/RH(D): A NEG
Antibody Screen: NEGATIVE

## 2017-09-24 LAB — RAPID HIV SCREEN (HIV 1/2 AB+AG)
HIV 1/2 Antibodies: NONREACTIVE
HIV-1 P24 ANTIGEN - HIV24: NONREACTIVE

## 2017-09-24 LAB — ABO/RH: ABO/RH(D): A NEG

## 2017-09-24 SURGERY — DILATATION AND CURETTAGE /HYSTEROSCOPY
Anesthesia: General | Site: Uterus | Wound class: Clean Contaminated

## 2017-09-24 MED ORDER — ACETAMINOPHEN NICU IV SYRINGE 10 MG/ML
INTRAVENOUS | Status: AC
  Filled 2017-09-24: qty 1

## 2017-09-24 MED ORDER — BUPIVACAINE-EPINEPHRINE (PF) 0.5% -1:200000 IJ SOLN
INTRAMUSCULAR | Status: AC
  Filled 2017-09-24: qty 30

## 2017-09-24 MED ORDER — DEXAMETHASONE SODIUM PHOSPHATE 10 MG/ML IJ SOLN
INTRAMUSCULAR | Status: AC
  Filled 2017-09-24: qty 1

## 2017-09-24 MED ORDER — ROCURONIUM BROMIDE 50 MG/5ML IV SOLN
INTRAVENOUS | Status: AC
  Filled 2017-09-24: qty 1

## 2017-09-24 MED ORDER — FENTANYL CITRATE (PF) 100 MCG/2ML IJ SOLN
INTRAMUSCULAR | Status: AC
  Administered 2017-09-24: 25 ug via INTRAVENOUS
  Filled 2017-09-24: qty 2

## 2017-09-24 MED ORDER — SUGAMMADEX SODIUM 200 MG/2ML IV SOLN
INTRAVENOUS | Status: AC
  Filled 2017-09-24: qty 2

## 2017-09-24 MED ORDER — ROCURONIUM BROMIDE 100 MG/10ML IV SOLN
INTRAVENOUS | Status: DC | PRN
  Administered 2017-09-24: 40 mg via INTRAVENOUS
  Administered 2017-09-24: 10 mg via INTRAVENOUS

## 2017-09-24 MED ORDER — LACTATED RINGERS IV SOLN: INTRAVENOUS | Status: DC

## 2017-09-24 MED ORDER — LIDOCAINE HCL (PF) 2 % IJ SOLN
INTRAMUSCULAR | Status: AC
  Filled 2017-09-24: qty 10

## 2017-09-24 MED ORDER — PHENYLEPHRINE HCL 10 MG/ML IJ SOLN
INTRAMUSCULAR | Status: DC | PRN
  Administered 2017-09-24 (×4): 150 ug via INTRAVENOUS

## 2017-09-24 MED ORDER — FENTANYL CITRATE (PF) 100 MCG/2ML IJ SOLN
INTRAMUSCULAR | Status: DC | PRN
  Administered 2017-09-24 (×2): 50 ug via INTRAVENOUS
  Administered 2017-09-24: 100 ug via INTRAVENOUS
  Administered 2017-09-24: 50 ug via INTRAVENOUS

## 2017-09-24 MED ORDER — FAMOTIDINE 20 MG PO TABS
20.0000 mg | ORAL_TABLET | Freq: Once | ORAL | Status: AC
  Administered 2017-09-24: 20 mg via ORAL

## 2017-09-24 MED ORDER — PROPOFOL 10 MG/ML IV BOLUS
INTRAVENOUS | Status: DC | PRN
Start: 2017-09-24 — End: 2017-09-24
  Administered 2017-09-24: 170 mg via INTRAVENOUS

## 2017-09-24 MED ORDER — SUGAMMADEX SODIUM 200 MG/2ML IV SOLN
INTRAVENOUS | Status: DC | PRN
  Administered 2017-09-24: 200 mg via INTRAVENOUS

## 2017-09-24 MED ORDER — ONDANSETRON HCL 4 MG/2ML IJ SOLN
INTRAMUSCULAR | Status: AC
  Filled 2017-09-24: qty 2

## 2017-09-24 MED ORDER — KETOROLAC TROMETHAMINE 30 MG/ML IJ SOLN
INTRAMUSCULAR | Status: DC | PRN
  Administered 2017-09-24: 30 mg via INTRAVENOUS

## 2017-09-24 MED ORDER — FENTANYL CITRATE (PF) 250 MCG/5ML IJ SOLN
INTRAMUSCULAR | Status: AC
  Filled 2017-09-24: qty 5

## 2017-09-24 MED ORDER — LACTATED RINGERS IV SOLN
INTRAVENOUS | Status: DC
  Administered 2017-09-24 (×2): via INTRAVENOUS

## 2017-09-24 MED ORDER — MIDAZOLAM HCL 2 MG/2ML IJ SOLN
INTRAMUSCULAR | Status: DC | PRN
  Administered 2017-09-24: 2 mg via INTRAVENOUS

## 2017-09-24 MED ORDER — LIDOCAINE HCL (CARDIAC) 20 MG/ML IV SOLN
INTRAVENOUS | Status: DC | PRN
  Administered 2017-09-24: 60 mg via INTRAVENOUS

## 2017-09-24 MED ORDER — PROPOFOL 10 MG/ML IV BOLUS
INTRAVENOUS | Status: AC
  Filled 2017-09-24: qty 20

## 2017-09-24 MED ORDER — MIDAZOLAM HCL 2 MG/2ML IJ SOLN
INTRAMUSCULAR | Status: AC
  Filled 2017-09-24: qty 2

## 2017-09-24 MED ORDER — DEXAMETHASONE SODIUM PHOSPHATE 10 MG/ML IJ SOLN
INTRAMUSCULAR | Status: DC | PRN
  Administered 2017-09-24: 10 mg via INTRAVENOUS

## 2017-09-24 MED ORDER — ONDANSETRON HCL 4 MG/2ML IJ SOLN
INTRAMUSCULAR | Status: DC | PRN
  Administered 2017-09-24: 4 mg via INTRAVENOUS

## 2017-09-24 MED ORDER — BUPIVACAINE-EPINEPHRINE 0.5% -1:200000 IJ SOLN
INTRAMUSCULAR | Status: DC | PRN
  Administered 2017-09-24: 10 mL

## 2017-09-24 MED ORDER — ACETAMINOPHEN 10 MG/ML IV SOLN
INTRAVENOUS | Status: DC | PRN
  Administered 2017-09-24: 1000 mg via INTRAVENOUS

## 2017-09-24 MED ORDER — IBUPROFEN 800 MG PO TABS: 800.0000 mg | ORAL_TABLET | Freq: Three times a day (TID) | ORAL | 1 refills | Status: DC

## 2017-09-24 MED ORDER — FENTANYL CITRATE (PF) 100 MCG/2ML IJ SOLN
25.0000 ug | INTRAMUSCULAR | Status: DC | PRN
  Administered 2017-09-24 (×2): 25 ug via INTRAVENOUS

## 2017-09-24 MED ORDER — OXYCODONE-ACETAMINOPHEN 5-325 MG PO TABS: 1.0000 | ORAL_TABLET | ORAL | 0 refills | Status: DC | PRN

## 2017-09-24 MED ORDER — SUCCINYLCHOLINE CHLORIDE 20 MG/ML IJ SOLN
INTRAMUSCULAR | Status: AC
  Filled 2017-09-24: qty 1

## 2017-09-24 MED ORDER — EPHEDRINE SULFATE 50 MG/ML IJ SOLN
INTRAMUSCULAR | Status: DC | PRN
  Administered 2017-09-24: 10 mg via INTRAVENOUS

## 2017-09-24 MED ORDER — KETOROLAC TROMETHAMINE 30 MG/ML IJ SOLN
INTRAMUSCULAR | Status: AC
  Filled 2017-09-24: qty 1

## 2017-09-24 MED ORDER — PROMETHAZINE HCL 25 MG/ML IJ SOLN: 6.2500 mg | INTRAMUSCULAR | Status: DC | PRN

## 2017-09-24 MED ORDER — FAMOTIDINE 20 MG PO TABS
ORAL_TABLET | ORAL | Status: AC
  Filled 2017-09-24: qty 1

## 2017-09-24 SURGICAL SUPPLY — 44 items
ANCHOR TIS RET SYS 235ML (MISCELLANEOUS) IMPLANT
BAG INFUSER PRESSURE 100CC (MISCELLANEOUS) ×3 IMPLANT
BLADE SURG SZ11 CARB STEEL (BLADE) ×3 IMPLANT
CANISTER SUCT 1200ML W/VALVE (MISCELLANEOUS) ×3 IMPLANT
CATH ROBINSON RED A/P 16FR (CATHETERS) ×3 IMPLANT
CHLORAPREP W/TINT 26ML (MISCELLANEOUS) ×3 IMPLANT
DERMABOND ADVANCED (GAUZE/BANDAGES/DRESSINGS) ×1
DERMABOND ADVANCED .7 DNX12 (GAUZE/BANDAGES/DRESSINGS) ×2 IMPLANT
DRSG TEGADERM 2-3/8X2-3/4 SM (GAUZE/BANDAGES/DRESSINGS) ×9 IMPLANT
DRSG TEGADERM 4X4.75 (GAUZE/BANDAGES/DRESSINGS) ×9 IMPLANT
DRSG TELFA 3X8 NADH (GAUZE/BANDAGES/DRESSINGS) ×3 IMPLANT
DRSG TELFA 4X3 1S NADH ST (GAUZE/BANDAGES/DRESSINGS) ×9 IMPLANT
GLOVE BIO SURGEON STRL SZ8 (GLOVE) ×3 IMPLANT
GLOVE INDICATOR 8.0 STRL GRN (GLOVE) ×3 IMPLANT
GOWN STRL REUS W/ TWL LRG LVL3 (GOWN DISPOSABLE) ×2 IMPLANT
GOWN STRL REUS W/ TWL XL LVL3 (GOWN DISPOSABLE) ×2 IMPLANT
GOWN STRL REUS W/TWL LRG LVL3 (GOWN DISPOSABLE) ×1
GOWN STRL REUS W/TWL XL LVL3 (GOWN DISPOSABLE) ×1
IRRIGATION STRYKERFLOW (MISCELLANEOUS) ×2 IMPLANT
IRRIGATOR STRYKERFLOW (MISCELLANEOUS) ×3
IV LACTATED RINGERS 1000ML (IV SOLUTION) ×3 IMPLANT
IV NS 1000ML (IV SOLUTION) ×1
IV NS 1000ML BAXH (IV SOLUTION) ×2 IMPLANT
KIT PINK PAD W/HEAD ARE REST (MISCELLANEOUS) ×3
KIT PINK PAD W/HEAD ARM REST (MISCELLANEOUS) ×2 IMPLANT
KIT RM TURNOVER CYSTO AR (KITS) ×3 IMPLANT
LABEL OR SOLS (LABEL) IMPLANT
NS IRRIG 500ML POUR BTL (IV SOLUTION) ×3 IMPLANT
PACK DNC HYST (MISCELLANEOUS) ×3 IMPLANT
PACK GYN LAPAROSCOPIC (MISCELLANEOUS) ×3 IMPLANT
PAD OB MATERNITY 4.3X12.25 (PERSONAL CARE ITEMS) ×3 IMPLANT
PAD PREP 24X41 OB/GYN DISP (PERSONAL CARE ITEMS) ×3 IMPLANT
POUCH ENDO CATCH 10MM SPEC (MISCELLANEOUS) IMPLANT
SCISSORS METZENBAUM CVD 33 (INSTRUMENTS) IMPLANT
SHEARS HARMONIC ACE PLUS 36CM (ENDOMECHANICALS) ×3 IMPLANT
SLEEVE ENDOPATH XCEL 5M (ENDOMECHANICALS) ×6 IMPLANT
SUT MNCRL 4-0 (SUTURE) ×1
SUT MNCRL 4-0 27XMFL (SUTURE) ×2
SUT VIC AB 0 CT2 27 (SUTURE) ×3 IMPLANT
SUT VIC AB 0 UR5 27 (SUTURE) ×3 IMPLANT
SUTURE MNCRL 4-0 27XMF (SUTURE) ×2 IMPLANT
SYR 30ML LL (SYRINGE) ×6 IMPLANT
TROCAR XCEL NON-BLD 5MMX100MML (ENDOMECHANICALS) ×3 IMPLANT
TUBING INSUFFLATION (TUBING) ×3 IMPLANT

## 2017-09-24 NOTE — Anesthesia Preprocedure Evaluation (Signed)
Anesthesia Evaluation  Patient identified by MRN, date of birth, ID band Patient awake    Reviewed: Allergy & Precautions, H&P , NPO status , Patient's Chart, lab work & pertinent test results, reviewed documented beta blocker date and time   History of Anesthesia Complications (+) PROLONGED EMERGENCE, Family history of anesthesia reaction and history of anesthetic complications  Airway Mallampati: III  TM Distance: >3 FB Neck ROM: full    Dental  (+) Missing, Dental Advidsory Given, Teeth Intact   Pulmonary neg pulmonary ROS,           Cardiovascular Exercise Tolerance: Good negative cardio ROS       Neuro/Psych  Headaches, neg Seizures negative psych ROS   GI/Hepatic negative GI ROS, Neg liver ROS,   Endo/Other  neg diabetesHypothyroidism   Renal/GU negative Renal ROS  negative genitourinary   Musculoskeletal   Abdominal   Peds  Hematology  (+) Blood dyscrasia, anemia ,   Anesthesia Other Findings Past Medical History: No date: Anemia     Comment:  iron deficiency 2014: Breast lump No date: Family history of adverse reaction to anesthesia     Comment:  MOM-HARD TO WAKE UP No date: Headache     Comment:  MIGRAINES No date: Hypothyroidism No date: Thyroid disease   Reproductive/Obstetrics negative OB ROS                             Anesthesia Physical Anesthesia Plan  ASA: II  Anesthesia Plan: General   Post-op Pain Management:    Induction: Intravenous  PONV Risk Score and Plan: 3 and Ondansetron and Dexamethasone  Airway Management Planned: Oral ETT  Additional Equipment:   Intra-op Plan:   Post-operative Plan: Extubation in OR  Informed Consent: I have reviewed the patients History and Physical, chart, labs and discussed the procedure including the risks, benefits and alternatives for the proposed anesthesia with the patient or authorized representative who has  indicated his/her understanding and acceptance.   Dental Advisory Given  Plan Discussed with: Anesthesiologist, CRNA and Surgeon  Anesthesia Plan Comments:         Anesthesia Quick Evaluation

## 2017-09-24 NOTE — Interval H&P Note (Signed)
History and Physical Interval Note:  09/24/2017 1:58 PM  Kimberly Garner  has presented today for surgery, with the diagnosis of DYSMENORRHEA, MENORRHAGIA, HISTORY OF BLOOD TRANSFUSION  The various methods of treatment have been discussed with the patient and family. After consideration of risks, benefits and other options for treatment, the patient has consented to  Procedure(s): DILATATION AND CURETTAGE /HYSTEROSCOPY (N/A) LAPAROSCOPY DIAGNOSTIC (N/A) LAPAROSCOPIC BILATERAL TUBAL LIGATION (Bilateral Salpingectomy) as a surgical intervention .  The patient's history has been reviewed, patient examined, no change in status, stable for surgery.  I have reviewed the patient's chart and labs.  Questions were answered to the patient's satisfaction.     Daphine DeutscherMartin A Shadee Montoya

## 2017-09-24 NOTE — Anesthesia Postprocedure Evaluation (Signed)
Anesthesia Post Note  Patient: Kimberly LownHeather Kowal  Procedure(s) Performed: DILATATION AND CURETTAGE /HYSTEROSCOPY (N/A Abdomen) LAPAROSCOPY DIAGNOSTIC (N/A Abdomen) LAPAROSCOPIC BILATERAL TUBAL LIGATION (Bilateral Uterus)  Patient location during evaluation: PACU Anesthesia Type: General Level of consciousness: awake and alert Pain management: pain level controlled Vital Signs Assessment: post-procedure vital signs reviewed and stable Respiratory status: spontaneous breathing and respiratory function stable Cardiovascular status: stable Anesthetic complications: no     Last Vitals:  Vitals:   09/24/17 1626 09/24/17 1703  BP:  114/62  Pulse: 87 85  Resp: 20 18  Temp: 37 C   SpO2: 97% 100%    Last Pain:  Vitals:   09/24/17 1626  TempSrc:   PainSc: 0-No pain                 Mayer Vondrak K

## 2017-09-24 NOTE — Op Note (Signed)
OPERATIVE NOTE:  Boris LownHeather Faye PROCEDURE DATE: 09/24/2017   PREOPERATIVE DIAGNOSIS: 1.  Severe dysmenorrhea 2.  Menorrhagia 3.  Desires sterilization  POSTOPERATIVE DIAGNOSIS: 1.  Severe dysmenorrhea 2.  Menorrhagia 3.  Desires sterilization  PROCEDURE: 1.  Laparoscopic bilateral salpingectomy and excision and fulguration of endometriosis 2.  Hysteroscopy/D&C SURGEON:  Herold HarmsMartin A Teruo Stilley, MD ASSISTANTS: Dr. Valentino Saxonherry ANESTHESIA: General INDICATIONS: 31 y.o. [id number] female on oral contraceptives without regulation of menorrhagia and severe dysmenorrhea, presents for surgical evaluation and management.  She also desires permanent sterilization.  FINDINGS: Grossly normal fallopian tubes and ovaries; extensive diffuse endometriosis implants over bladder flap, right and left ovarian fossa, cul-de-sac, and anterior abdominal wall.  Upper abdomen was normal. The vaginal outlet was slightly narrowed; there was fair descent with tenaculum traction.  The proximal vagina seemed narrowed.  Should this patient require hysterectomy in the future, I would recommend an abdominal hysterectomy through a Pfannenstiel incision.   I/O's: Total I/O In: 1000 [I.V.:1000] Out: 200 [Urine:200] COUNTS:  YES SPECIMENS: 1.  Right and left fallopian tubes 2.  Peritoneal excisional biopsies (cul-de-sac, left ovarian fossa, right ovarian fossa, bladder flap, right anterior abdominal wall) 3.  Endometrial curettings  ANTIBIOTIC PROPHYLAXIS:N/A COMPLICATIONS: None immediate  PROCEDURE IN DETAIL: Patient was brought to the operating room and placed in the supine position.  General endotracheal anesthesia was induced without difficulty.  She was placed in the dorsolithotomy position using the bumblebee stirrups. Timeout was completed. Red Robinson catheter was used to drain 200 mL of urine from the bladder.  A pocket tenaculum was placed onto the cervix to facilitate uterine manipulation. LAPAROSCOPY:  Subumbilical 5 mm vertical incision was made.  The Optiview laparoscopic trocar system was used to place a 5 mm port directly into the abdominal pelvic cavity without evidence of bowel or vascular injury.  2 other 5 mm ports were placed in the right and left lower quadrants respectively.  The above-noted findings were photo documented.  The bilateral salpingectomy was performed using the Ace harmonic scalpel along with graspers.  The mesosalpinx was clamped coagulated and cut sequentially to the level of the uterine cornu after which the fallopian tube was excised using the Ace harmonic scalpel.  The specimen was removed through the 5 mm port without difficulty.  A similar procedure was carried out on the contralateral fallopian tube.  The excision and fulguration of endometriosis was then performed using biopsy forceps-areas of excision included the cul-de-sac, right ovarian fossa, left ovarian fossa, anterior bladder flap, and right anterior abdominal wall peritoneum.  These areas were then fulgurated with Kleppinger bipolar forceps to optimize hemostasis and treat the endometriosis.  The pelvis was copiously irrigated following the procedure.  The irrigant fluid was aspirated.  The laparoscopy portion of the procedure was then terminated with pneumoperitoneum being released, and the incisions were closed with 4-0 Vicryl suture in a simple interrupted manner.  Dermabond glue was placed over the incisions. HYSTEROSCOPY/D&C: The Hauger tenaculum was removed from the cervix and a single-tooth tenaculum was placed on the posterior lip of the cervix.  The endocervical canal was dilated with Hanks dilators.  The Myosure hysteroscope was then used with lactated Ringer's as irrigant to assess the intrauterine cavity.  No significant endometrial lesions were identified.  Smooth and serrated curettes were then used to obtain the endometrial specimen.  This was followed by the use of stone polyp forceps to remove any  residual tissue left behind.  Average curettings were obtained.  Repeat hysteroscopy demonstrated an excellent  sampling of the endometrial cavity.  The procedure was then terminated with all instrumentation being removed from the vagina.  The patient was awakened mobilized and taken to recovery room in satisfactory condition.    Jaysean Manville A. Beatris Sie Francesco, MD, A[facility]

## 2017-09-24 NOTE — Transfer of Care (Signed)
Immediate Anesthesia Transfer of Care Note  Patient: Kimberly LownHeather Garner  Procedure(s) Performed: Procedure(s): DILATATION AND CURETTAGE /HYSTEROSCOPY (N/A) LAPAROSCOPY DIAGNOSTIC (N/A) LAPAROSCOPIC BILATERAL TUBAL LIGATION (Bilateral)  Patient Location: PACU  Anesthesia Type:General  Level of Consciousness: sedated  Airway & Oxygen Therapy: Patient Spontanous Breathing and Patient connected to face mask oxygen  Post-op Assessment: Report given to RN and Post -op Vital signs reviewed and stable  Post vital signs: Reviewed and stable  Last Vitals:  Vitals:   09/24/17 1035 09/24/17 1540  BP: 115/74 (!) 123/57  Pulse: 77   Resp: 16   Temp: 37.1 C 36.8 C  SpO2: 99%     Complications: No apparent anesthesia complications

## 2017-09-24 NOTE — Anesthesia Procedure Notes (Signed)
Procedure Name: Intubation Date/Time: 09/24/2017 2:22 PM Performed by: Eben Burow, CRNA Pre-anesthesia Checklist: Patient identified, Emergency Drugs available, Suction available, Patient being monitored and Timeout performed Patient Re-evaluated:Patient Re-evaluated prior to induction Oxygen Delivery Method: Circle system utilized Preoxygenation: Pre-oxygenation with 100% oxygen Induction Type: IV induction Ventilation: Mask ventilation without difficulty Laryngoscope Size: Miller and 2 Grade View: Grade I Tube type: Oral Tube size: 7.5 mm Number of attempts: 1 Airway Equipment and Method: Stylet and LTA kit utilized Placement Confirmation: ETT inserted through vocal cords under direct vision,  positive ETCO2 and breath sounds checked- equal and bilateral Secured at: 22 cm Tube secured with: Tape Dental Injury: Teeth and Oropharynx as per pre-operative assessment

## 2017-09-24 NOTE — Discharge Instructions (Signed)
1)     2)     3)         Laparoscopic Tubal Ligation, Care After Refer to this sheet in the next few weeks. These instructions provide you with information about caring for yourself after your procedure. Your health care provider may also give you more specific instructions. Your treatment has been planned according to current medical practices, but problems sometimes occur. Call your health care provider if you have any problems or questions after your procedure. What can I expect after the procedure? After the procedure, it is common to have:  A sore throat.  Discomfort in your shoulder.  Mild discomfort or cramping in your abdomen.  Gas pains.  Pain or soreness in the area where the surgical cut (incision) was made.  A bloated feeling.  Tiredness.  Nausea.  Vomiting.  Follow these instructions at home: Medicines  Take over-the-counter and prescription medicines only as told by your health care provider.  Do not take aspirin because it can cause bleeding.  Do not drive or operate heavy machinery while taking prescription pain medicine. Activity  Rest for the rest of the day.  Return to your normal activities as told by your health care provider. Ask your health care provider what activities are safe for you. Incision care   Follow instructions from your health care provider about how to take care of your incision. Make sure you: ? Wash your hands with soap and water before you change your bandage (dressing). If soap and water are not available, use hand sanitizer. ? Change your dressing as told by your health care provider. ? Leave stitches (sutures) in place. They may need to stay in place for 2 weeks or longer.  Check your incision area every day for signs of infection. Check for: ? More redness, swelling, or pain. ? More fluid or blood. ? Warmth. ? Pus or a bad smell. Other Instructions  Do not take baths, swim, or use a hot tub until  your health care provider approves. You may take showers.  Keep all follow-up visits as told by your health care provider. This is important.  Have someone help you with your daily household tasks for the first few days. Contact a health care provider if:  You have more redness, swelling, or pain around your incision.  Your incision feels warm to the touch.  You have pus or a bad smell coming from your incision.  The edges of your incision break open after the sutures have been removed.  Your pain does not improve after 2-3 days.  You have a rash.  You repeatedly become dizzy or light-headed.  Your pain medicine is not helping.  You are constipated. Get help right away if:  You have a fever.  You faint.  You have increasing pain in your abdomen.  You have severe pain in one or both of your shoulders.  You have fluid or blood coming from your sutures or from your vagina.  You have shortness of breath or difficulty breathing.  You have chest pain or leg pain.  You have ongoing nausea, vomiting, or diarrhea. This information is not intended to replace advice given to you by your health care provider. Make sure you discuss any questions you have with your health care provider. Document Released: 05/05/2005 Document Revised: 03/20/2016 Document Reviewed: 09/26/2015 Elsevier Interactive Patient Education  2018 Elsevier Inc.    AMBULATORY SURGERY  DISCHARGE INSTRUCTIONS   4) The drugs  that you were given will stay in your system until tomorrow so for the next 24 hours you should not:  A) Drive an automobile B) Make any legal decisions C) Drink any alcoholic beverage   5) You may resume regular meals tomorrow.  Today it is better to start with liquids and gradually work up to solid foods.  You may eat anything you prefer, but it is better to start with liquids, then soup and crackers, and gradually work up to solid foods.   6) Please notify your doctor  immediately if you have any unusual bleeding, trouble breathing, redness and pain at the surgery site, drainage, fever, or pain not relieved by medication.    7) Additional Instructions:        Please contact your physician with any problems or Same Day Surgery at [phone], Monday through Friday 6 am to 4 pm, or [place] at [facility] Main number at [phone].

## 2017-09-24 NOTE — Anesthesia Post-op Follow-up Note (Signed)
Anesthesia QCDR form completed.        

## 2017-09-25 ENCOUNTER — Encounter: Payer: Self-pay | Admitting: Obstetrics and Gynecology

## 2017-09-25 LAB — RPR: RPR Ser Ql: NONREACTIVE

## 2017-09-27 LAB — SURGICAL PATHOLOGY

## 2017-09-28 ENCOUNTER — Encounter: Payer: Self-pay | Admitting: Obstetrics and Gynecology

## 2017-10-03 ENCOUNTER — Ambulatory Visit (INDEPENDENT_AMBULATORY_CARE_PROVIDER_SITE_OTHER): Payer: 59 | Admitting: Obstetrics and Gynecology

## 2017-10-03 ENCOUNTER — Encounter: Payer: Self-pay | Admitting: Obstetrics and Gynecology

## 2017-10-03 VITALS — BP 102/67 | HR 81 | Ht 61.0 in | Wt 196.1 lb

## 2017-10-03 DIAGNOSIS — Z9889 Other specified postprocedural states: Secondary | ICD-10-CM

## 2017-10-03 DIAGNOSIS — N921 Excessive and frequent menstruation with irregular cycle: Secondary | ICD-10-CM

## 2017-10-03 DIAGNOSIS — N809 Endometriosis, unspecified: Secondary | ICD-10-CM

## 2017-10-03 DIAGNOSIS — Z09 Encounter for follow-up examination after completed treatment for conditions other than malignant neoplasm: Secondary | ICD-10-CM

## 2017-10-03 DIAGNOSIS — Z9079 Acquired absence of other genital organ(s): Secondary | ICD-10-CM

## 2017-10-03 NOTE — Patient Instructions (Signed)
1.  Resume activities as tolerated 2.  Discontinue birth control pills at this time 3.  Maintain menstrual calendar monitoring for assessment of abnormal uterine bleeding 4.  Return in 3 months for follow-up 5.  For pelvic pain I recommend Naprosyn or ibuprofen as needed.

## 2017-10-03 NOTE — Progress Notes (Signed)
Chief complaint: 1.  1 week postop check 2.  Status post laparoscopic bilateral salpingectomy and excision and fulguration of endometriosis 3.  Status post hysteroscopy/D&C  Patient presents for 1 week postop check.  She is currently doing well with normal bowel and bladder function.  The patient did develop a pruritic abdominal rash, petechial L, last week, which was treated with Benadryl and hydrocortisone cream.  The patient is aware that the rash may be related to Percocet taken for postop analgesia or may be related to the ChloraPrep surgical prep solution.  She is minimally symptomatic at this time. Patient is taking birth control pills at this time for attempted regulation of abnormal uterine bleeding.  Pathology from surgery: DIAGNOSIS:  A. RIGHT FALLOPIAN TUBE; SALPINGECTOMY:  - FULL CROSS SECTION OF THE LUMEN.  - PARATUBAL CYST.  - UNREMARKABLE FIMBRIA.   B. LEFT FALLOPIAN TUBE; SALPINGECTOMY:  - FULL CROSS SECTION OF THE LUMEN.  - UNREMARKABLE FIMBRIA.   C. CUL-DE-SAC; BIOPSY:  - ENDOMETRIOSIS.   D. RIGHT OVARIAN FOSSA; BIOPSY:  - FIBROUS ADHESION.   E. LEFT OVARIAN FOSSA; BIOPSY:  - FIBROUS ADHESION.  - SEPARATE FRAGMENTS OF ENDOMETRIAL-TYPE GLANDS AND BLOOD.   F. BLADDER FLAP; BIOPSY:  - FIBROUS ADHESION.   G. ANTERIOR ABDOMINAL WALL; BIOPSY:  - FIBROUS ADHESION.   H. ENDOMETRIAL CURETTINGS; DILATATION AND CURETTAGE:  - PROLIFERATIVE ENDOMETRIUM.  - NEGATIVE FOR HYPERPLASIA AND CARCINOMA.  OBJECTIVE:  BP 102/67   Pulse 81   Ht 5\' 1"  (1.549 m)   Wt 196 lb 1.6 oz (89 kg)   LMP 09/17/2017 (Exact Date)   BMI 37.05 kg/m  Pleasant female in no acute distress. Abdomen: Soft, nontender; laparoscopy incision sites are well approximated with minimal erythema and without drainage; subumbilical incision is slightly tender Pelvic: Deferred  ASSESSMENT: 1.  History of severe dysmenorrhea and abnormal uterine bleeding 2.  1 week postop check-normal 3.   Status post laparoscopic bilateral salpingectomy for contraception 4.  Endometriosis, excised and cauterized from cul-de-sac 5.  Status post hysteroscopy/D&C 6.  Currently taking birth control pills for attempted cycle regulation  PLAN: 1.  Resume activities as tolerated 2  Discontinue birth control pills at this time 3.  Maintain menstrual calendar monitoring for assessment of abnormal uterine bleeding 4.  Ibuprofen or naproxen as needed for pelvic pain 5.  Return in 3 months for follow-up and further management planning 6.  Should hysterectomy be chosen for definitive treatment of endometriosis, the patient understands that she will need abdominal hysterectomy because of pelvic anatomy.  Herold HarmsMartin A Trung Wenzl, MD  Note: This dictation was prepared with Dragon dictation along with smaller phrase technology. Any transcriptional errors that result from this process are unintentional.

## 2017-10-15 ENCOUNTER — Encounter: Payer: Self-pay | Admitting: Obstetrics and Gynecology

## 2017-11-28 ENCOUNTER — Encounter: Payer: Self-pay | Admitting: Family Medicine

## 2017-11-28 ENCOUNTER — Ambulatory Visit: Payer: 59 | Admitting: Family Medicine

## 2017-11-28 VITALS — BP 102/70 | HR 99 | Temp 98.3°F | Wt 198.8 lb

## 2017-11-28 DIAGNOSIS — R05 Cough: Secondary | ICD-10-CM

## 2017-11-28 DIAGNOSIS — R059 Cough, unspecified: Secondary | ICD-10-CM

## 2017-11-28 MED ORDER — AMOXICILLIN 875 MG PO TABS: 875.0000 mg | ORAL_TABLET | Freq: Two times a day (BID) | ORAL | 0 refills | Status: DC

## 2017-11-28 MED ORDER — ALBUTEROL SULFATE HFA 108 (90 BASE) MCG/ACT IN AERS: 2.0000 | INHALATION_SPRAY | RESPIRATORY_TRACT | 1 refills | Status: DC | PRN

## 2017-11-28 MED ORDER — BENZONATATE 100 MG PO CAPS
100.0000 mg | ORAL_CAPSULE | Freq: Three times a day (TID) | ORAL | 0 refills | Status: DC | PRN
Start: 2017-11-28 — End: 2018-01-31

## 2017-11-28 NOTE — Progress Notes (Signed)
Subjective:    Patient ID: Kimberly LownHeather Cooprider, female    DOB: 03/22/1986, 32 y.o.   MRN: [id number]  HPI This is a 32 yo female, accompanied by her 684 yo son, who presents today with cough x 1.5 weeks. Yesterday had pain with breathing, chest feels tight. No recent fever. Started with nasal drainage which has improved, no recent headache, sore throat or ear pain. Improved with Sudafed and Mucinex 12 hour chest congestion. She works at [employer] and MD listened to her yesterday and heard wheezes.  Cough is productive of small amount yellow-green sputum. No fatigue or generalized aches. Cough worse in afternoon/evening. Sleeping well.  Has required inhaler in past for reactive airway. Doesn't feel bad, normal energy.   Past Medical History:  Diagnosis Date  . Anemia    iron deficiency  . Breast lump 2014  . Family history of adverse reaction to anesthesia    MOM-HARD TO WAKE UP  . Headache    MIGRAINES  . Hypothyroidism   . Thyroid disease    Past Surgical History:  Procedure Laterality Date  . CESAREAN SECTION  2014  . CHOLECYSTECTOMY  2012  . FETAL BLOOD TRANSFUSION  2010  . FOOT SURGERY  2013  . HYSTEROSCOPY W/D&C N/A 09/24/2017   Procedure: DILATATION AND CURETTAGE /HYSTEROSCOPY;  Surgeon: Defrancesco, Prentice DockerMartin A, MD;  Location: [facility] ORS;  Service: Gynecology;  Laterality: N/A;  . LAPAROSCOPIC TUBAL LIGATION Bilateral 09/24/2017   Procedure: LAPAROSCOPIC BILATERAL TUBAL LIGATION;  Surgeon: Herold Harmsefrancesco, Martin A, MD;  Location: [facility];  Service: Gynecology;  Laterality: Bilateral;  . LAPAROSCOPY N/A 09/24/2017   Procedure: LAPAROSCOPY DIAGNOSTIC;  Surgeon: Herold Harmsefrancesco, Martin A, MD;  Location: [facility];  Service: Gynecology;  Laterality: N/A;   Family History  Problem Relation Age of Onset  . Hypertension Mother   . Diabetes Mother   . Breast cancer Neg Hx   . Ovarian cancer Neg Hx   . Colon cancer Neg Hx    Social History   Tobacco Use  . Smoking status: Never Smoker    . Smokeless tobacco: Never Used  Substance Use Topics  . Alcohol use: Yes    Comment: OCC  . Drug use: No      Review of Systems Per HPI    Objective:   Physical Exam  Constitutional: She is oriented to person, place, and time. She appears well-developed and well-nourished.  HENT:  Head: Normocephalic and atraumatic.  Right Ear: Tympanic membrane, external ear and ear canal normal.  Left Ear: Tympanic membrane, external ear and ear canal normal.  Nose: Nose normal.  Mouth/Throat: Uvula is midline and oropharynx is clear and moist.  Eyes: Conjunctivae are normal.  Neck: Normal range of motion. Neck supple.  Cardiovascular: Normal rate, regular rhythm and normal heart sounds.  Pulmonary/Chest: Effort normal. No respiratory distress. She has wheezes (few faint posterior expiratory). She has no rales.  Lymphadenopathy:    She has no cervical adenopathy.  Neurological: She is alert and oriented to person, place, and time.  Skin: Skin is warm and dry.  Psychiatric: She has a normal mood and affect. Her behavior is normal. Judgment and thought content normal.  Vitals reviewed.     BP 102/70 (BP Location: Left Arm, Patient Position: Sitting, Cuff Size: Normal)   Pulse 99   Temp 98.3 F (36.8 C) (Oral)   Wt 198 lb 12.8 oz (90.2 kg)   SpO2 98%   BMI 37.56 kg/m  Wt Readings from Last 3 Encounters:  11/28/17 198 lb 12.8 oz (90.2 kg)  10/03/17 196 lb 1.6 oz (89 kg)  09/24/17 195 lb (88.5 kg)       Assessment & Plan:  1. Cough - likely post viral, will treat with albuterol inhaler and tessalon perles, she was provided a wait and see antibiotic prescription if not better in 3-4 days.  - albuterol (PROVENTIL HFA;VENTOLIN HFA) 108 (90 Base) MCG/ACT inhaler; Inhale 2 puffs into the lungs every 4 (four) hours as needed for wheezing or shortness of breath (cough, shortness of breath or wheezing.).  Dispense: 1 Inhaler; Refill: 1 - amoxicillin (AMOXIL) 875 MG tablet; Take 1 tablet  (875 mg total) by mouth 2 (two) times daily.  Dispense: 14 tablet; Refill: 0 - benzonatate (TESSALON) 100 MG capsule; Take 1-2 capsules (100-200 mg total) by mouth 3 (three) times daily as needed for cough.  Dispense: 20 capsule; Refill: 0 -  Patient Instructions  For nasal congestion you can use Afrin nasal spray for 3 days max, Sudafed, saline nasal spray (generic is fine for all). Drink enough fluids to make your urine light yellow. For fever/chill/muscle aches you can take over the counter acetaminophen or ibuprofen.  If not better in 3-4 days, can start antibiotic Please come back in if you are not better in 5-7 days or if you develop wheezing, shortness of breath or persistent vomiting.    Olean Ree, FNP-BC   Primary Care at [facility], [facility]  11/28/2017 8:27 AM

## 2017-11-28 NOTE — Patient Instructions (Signed)
For nasal congestion you can use Afrin nasal spray for 3 days max, Sudafed, saline nasal spray (generic is fine for all). Drink enough fluids to make your urine light yellow. For fever/chill/muscle aches you can take over the counter acetaminophen or ibuprofen.  If not better in 3-4 days, can start antibiotic Please come back in if you are not better in 5-7 days or if you develop wheezing, shortness of breath or persistent vomiting.

## 2017-11-30 ENCOUNTER — Other Ambulatory Visit: Payer: Self-pay | Admitting: Family Medicine

## 2017-11-30 MED ORDER — ALBUTEROL SULFATE HFA 108 (90 BASE) MCG/ACT IN AERS: 2.0000 | INHALATION_SPRAY | Freq: Four times a day (QID) | RESPIRATORY_TRACT | 1 refills | Status: AC | PRN

## 2018-01-01 ENCOUNTER — Encounter: Payer: 59 | Admitting: Obstetrics and Gynecology

## 2018-01-01 DIAGNOSIS — Z719 Counseling, unspecified: Secondary | ICD-10-CM | POA: Diagnosis not present

## 2018-01-22 DIAGNOSIS — Z719 Counseling, unspecified: Secondary | ICD-10-CM | POA: Diagnosis not present

## 2018-01-23 DIAGNOSIS — Z719 Counseling, unspecified: Secondary | ICD-10-CM | POA: Diagnosis not present

## 2018-01-30 ENCOUNTER — Encounter: Payer: 59 | Admitting: Obstetrics and Gynecology

## 2018-01-30 DIAGNOSIS — Z719 Counseling, unspecified: Secondary | ICD-10-CM | POA: Diagnosis not present

## 2018-01-31 ENCOUNTER — Ambulatory Visit (INDEPENDENT_AMBULATORY_CARE_PROVIDER_SITE_OTHER): Payer: 59 | Admitting: Obstetrics and Gynecology

## 2018-01-31 ENCOUNTER — Encounter: Payer: Self-pay | Admitting: Obstetrics and Gynecology

## 2018-01-31 VITALS — BP 112/71 | HR 81 | Ht 61.0 in | Wt 195.2 lb

## 2018-01-31 DIAGNOSIS — R5383 Other fatigue: Secondary | ICD-10-CM | POA: Diagnosis not present

## 2018-01-31 DIAGNOSIS — E282 Polycystic ovarian syndrome: Secondary | ICD-10-CM

## 2018-01-31 DIAGNOSIS — H5213 Myopia, bilateral: Secondary | ICD-10-CM | POA: Diagnosis not present

## 2018-01-31 DIAGNOSIS — N809 Endometriosis, unspecified: Secondary | ICD-10-CM | POA: Diagnosis not present

## 2018-01-31 DIAGNOSIS — E039 Hypothyroidism, unspecified: Secondary | ICD-10-CM | POA: Diagnosis not present

## 2018-01-31 NOTE — Progress Notes (Signed)
Chief complaint: 1.  Follow-up on endometriosis 2.  PCO 3.  History of abnormal uterine bleeding 4.  Fatigue  Kimberly Garner presents today for follow-up.  She is status post laparoscopy with peritoneal biopsies which had confirmed endometriosis of the cul-de-sac in December 2018.  She also had hysteroscopy/D&C for abnormal uterine bleeding.  Review of menstrual calendar monitoring over the past 3 months has been notable for regular cycles on a monthly basis.  Only the first of the past 3 cycles were notable for dysmenorrhea in the form of back pain for which she had to take ibuprofen. Bowel bladder function are normal. Patient has been noticing some increased fatigue of late with increased sleepiness.  She does have past history of hypothyroidism.  She does have history of chronic anemia.  Past medical history, past surgical history, problem list, medications, and allergies are reviewed  OBJECTIVE: BP 112/71   Pulse 81   Ht 5\' 1"  (1.549 m)   Wt 195 lb 3.2 oz (88.5 kg)   LMP 01/05/2018 (Exact Date)   BMI 36.88 kg/m  Pleasant well-appearing female no acute distress.  Alert and oriented. Physical exam-deferred  ASSESSMENT: 1.  History of endometriosis, asymptomatic at this time 2.  Status post laparoscopy with excision and fulguration of endometriosis in December 2018 3.  History of PCO and history of abnormal uterine bleeding, resolved following hysteroscopy/D&C in December 2018 4.  Fatigue with increased sleepiness; history of hypothyroidism and anemia 5.  History of dysmenorrhea, only 1 month notable for low back pain requiring medication since December 2018  PLAN: 1.  CBC and TSH today 2.  Maintain menstrual calendar monitoring and symptom monitoring over the next 6 months 3.  Return in 6 months for annual exam and follow-up  A total of 15 minutes were spent face-to-face with the patient during this encounter and over half of that time dealt with counseling and coordination of  care.  Herold HarmsMartin A Jamear Carbonneau, MD  Note: This dictation was prepared with Dragon dictation along with smaller phrase technology. Any transcriptional errors that result from this process are unintentional.

## 2018-01-31 NOTE — Patient Instructions (Addendum)
1.  TSH is ordered today.  CBC is ordered today. 2.  Maintain menstrual calendar monitoring for any abnormal uterine bleeding 3.  Continue as needed with ibuprofen 800 mg for cramps 4.  Return in 6 months for annual exam

## 2018-02-01 LAB — CBC
Hematocrit: 39.8 % (ref 34.0–46.6)
Hemoglobin: 13.5 g/dL (ref 11.1–15.9)
MCH: 31.3 pg (ref 26.6–33.0)
MCHC: 33.9 g/dL (ref 31.5–35.7)
MCV: 92 fL (ref 79–97)
PLATELETS: 191 10*3/uL (ref 150–379)
RBC: 4.32 x10E6/uL (ref 3.77–5.28)
RDW: 13.3 % (ref 12.3–15.4)
WBC: 6.1 10*3/uL (ref 3.4–10.8)

## 2018-02-01 LAB — THYROID PANEL WITH TSH
Free Thyroxine Index: 1.8 (ref 1.2–4.9)
T3 UPTAKE RATIO: 28 % (ref 24–39)
T4 TOTAL: 6.3 ug/dL (ref 4.5–12.0)
TSH: 1.86 u[IU]/mL (ref 0.450–4.500)

## 2018-05-27 ENCOUNTER — Other Ambulatory Visit: Payer: Self-pay | Admitting: Family Medicine

## 2018-05-29 NOTE — Telephone Encounter (Signed)
Dr. Dayton MartesAron pt. Forwarding to Dr. Dayton MartesAron.

## 2018-06-20 ENCOUNTER — Ambulatory Visit (INDEPENDENT_AMBULATORY_CARE_PROVIDER_SITE_OTHER): Payer: 59 | Admitting: Family Medicine

## 2018-06-20 VITALS — BP 118/82 | HR 86 | Temp 97.6°F | Ht 61.75 in | Wt 200.8 lb

## 2018-06-20 DIAGNOSIS — E559 Vitamin D deficiency, unspecified: Secondary | ICD-10-CM | POA: Insufficient documentation

## 2018-06-20 DIAGNOSIS — R350 Frequency of micturition: Secondary | ICD-10-CM

## 2018-06-20 DIAGNOSIS — E785 Hyperlipidemia, unspecified: Secondary | ICD-10-CM

## 2018-06-20 DIAGNOSIS — Z0000 Encounter for general adult medical examination without abnormal findings: Secondary | ICD-10-CM | POA: Insufficient documentation

## 2018-06-20 DIAGNOSIS — E038 Other specified hypothyroidism: Secondary | ICD-10-CM

## 2018-06-20 LAB — POCT URINALYSIS DIPSTICK
BILIRUBIN UA: NEGATIVE
Blood, UA: NEGATIVE
GLUCOSE UA: NEGATIVE
KETONES UA: NEGATIVE
Leukocytes, UA: NEGATIVE
Nitrite, UA: NEGATIVE
Protein, UA: NEGATIVE
Spec Grav, UA: 1.015 (ref 1.010–1.025)
Urobilinogen, UA: 0.2 E.U./dL
pH, UA: 7 (ref 5.0–8.0)

## 2018-06-20 LAB — COMPREHENSIVE METABOLIC PANEL
ALBUMIN: 4.6 g/dL (ref 3.5–5.2)
ALK PHOS: 69 U/L (ref 39–117)
ALT: 16 U/L (ref 0–35)
AST: 18 U/L (ref 0–37)
BUN: 16 mg/dL (ref 6–23)
CALCIUM: 9.7 mg/dL (ref 8.4–10.5)
CO2: 25 mEq/L (ref 19–32)
Chloride: 104 mEq/L (ref 96–112)
Creatinine, Ser: 0.82 mg/dL (ref 0.40–1.20)
GFR: 85.71 mL/min (ref >60.00)
Glucose, Bld: 87 mg/dL (ref 70–99)
Potassium: 4.2 mEq/L (ref 3.5–5.1)
Sodium: 135 mEq/L (ref 135–145)
TOTAL PROTEIN: 7.8 g/dL (ref 6.0–8.3)
Total Bilirubin: 0.5 mg/dL (ref 0.2–1.2)

## 2018-06-20 LAB — T4, FREE: FREE T4: 0.82 ng/dL (ref 0.60–1.60)

## 2018-06-20 LAB — VITAMIN D 25 HYDROXY (VIT D DEFICIENCY, FRACTURES): VITD: 27.69 ng/mL — ABNORMAL LOW (ref 30.00–100.00)

## 2018-06-20 LAB — CBC
HCT: 41.7 % (ref 36.0–46.0)
HEMOGLOBIN: 14.3 g/dL (ref 12.0–15.0)
MCHC: 34.4 g/dL (ref 30.0–36.0)
MCV: 91.7 fl (ref 78.0–100.0)
PLATELETS: 230 10*3/uL (ref 150.0–400.0)
RBC: 4.55 Mil/uL (ref 3.87–5.11)
RDW: 13.1 % (ref 11.5–15.5)
WBC: 6.2 10*3/uL (ref 4.0–10.5)

## 2018-06-20 LAB — LIPID PANEL
CHOL/HDL RATIO: 3
Cholesterol: 159 mg/dL (ref 0–200)
HDL: 50.9 mg/dL (ref >39.00)
LDL Cholesterol: 82 mg/dL (ref 0–99)
NONHDL: 107.95
Triglycerides: 130 mg/dL (ref 0.0–149.0)
VLDL: 26 mg/dL (ref 0.0–40.0)

## 2018-06-20 LAB — TSH: TSH: 2.02 u[IU]/mL (ref 0.35–4.50)

## 2018-06-20 NOTE — Assessment & Plan Note (Signed)
Continue current dose of synthroid. Check complete thyroid panel.

## 2018-06-20 NOTE — Patient Instructions (Signed)
Great to see you. I will call you with your lab results from today and you can view them online.   

## 2018-06-20 NOTE — Assessment & Plan Note (Signed)
Reviewed preventive care protocols, scheduled due services, and updated immunizations Discussed nutrition, exercise, diet, and healthy lifestyle.  

## 2018-06-20 NOTE — Assessment & Plan Note (Signed)
Overdue for lipid panel, she is fasting today. Will check lipid panel and CMET. Has been diet controlled.

## 2018-06-20 NOTE — Assessment & Plan Note (Signed)
Neg UA.  Reassurance provided. Push fluids, avoid spicy foods and caffeine.  Call or return to clinic prn if these symptoms worsen or fail to improve as anticipated. The patient indicates understanding of these issues and agrees with the plan.

## 2018-06-20 NOTE — Assessment & Plan Note (Signed)
Repeat Vit D today.  Has had some increased fatigued with h/o vit D deficiency.

## 2018-06-20 NOTE — Progress Notes (Signed)
Subjective:   Patient ID: Kimberly Garner, female    DOB: June 27, 1986, 32 y.o.   MRN: [id number]  Kimberly Garner is a pleasant 32 y.o. year old female who presents to clinic today with Annual Exam (Patient is here today for a CPE.  Last PAP was 11.18 at [facility] after surgical procedure and WNL and TSH; CBC done on 4.4.19 WNL  Has a H/O low Vit-D which has not been retested since 12.6.17.  She is currently fasting.  She has had urinary frequency and would like to have a U/A done.)  on 06/20/2018  HPI:  Last pap smear was done in 08/2017 after she had a laparoscopic BTL and fulguration of endometriosis.  Was taken off OCPs to see if her periods would become less painful.  They have not yet been better.  She will follow up with GYN in October.  She is having some urinary frequency without dysuria and asking asking for a UA to be checked today. Health Maintenance  Topic Date Due  . INFLUENZA VACCINE  07/21/2018 (Originally 05/30/2018)  . PAP SMEAR  10/05/2019  . TETANUS/TDAP  04/25/2020  . HIV Screening  Completed   Hypothyroidism- currently taking synthroid 75 mcg daily. Lab Results  Component Value Date   TSH 1.860 01/31/2018   Lab Results  Component Value Date   CHOL 145 10/04/2016   HDL 49.40 10/04/2016   LDLCALC 61 10/04/2016   LDLDIRECT 68.4 07/28/2010   TRIG 173.0 (H) 10/04/2016   CHOLHDL 3 10/04/2016    Current Outpatient Medications on File Prior to Visit  Medication Sig Dispense Refill  . albuterol (VENTOLIN HFA) 108 (90 Base) MCG/ACT inhaler Inhale 2 puffs into the lungs every 6 (six) hours as needed for wheezing or shortness of breath. 8 g 1  . ibuprofen (ADVIL,MOTRIN) 800 MG tablet Take 1 tablet (800 mg total) by mouth 3 (three) times daily. 30 tablet 1  . levothyroxine (SYNTHROID, LEVOTHROID) 75 MCG tablet TAKE 1 TABLET(75 MCG) BY MOUTH DAILY BEFORE BREAKFAST 90 tablet 0  . Prenatal Multivit-Min-Fe-FA (PRENATAL VITAMINS PO) Take 1 tablet by mouth daily.     No current  facility-administered medications on file prior to visit.     No Known Allergies  Past Medical History:  Diagnosis Date  . Anemia    iron deficiency  . Breast lump 2014  . Family history of adverse reaction to anesthesia    MOM-HARD TO WAKE UP  . Headache    MIGRAINES  . Hypothyroidism   . Thyroid disease     Past Surgical History:  Procedure Laterality Date  . CESAREAN SECTION  2014  . CHOLECYSTECTOMY  2012  . FETAL BLOOD TRANSFUSION  2010  . FOOT SURGERY  2013  . HYSTEROSCOPY W/D&C N/A 09/24/2017   Procedure: DILATATION AND CURETTAGE /HYSTEROSCOPY;  Surgeon: Defrancesco, Prentice Docker, MD;  Location: [facility] ORS;  Service: Gynecology;  Laterality: N/A;  . LAPAROSCOPIC TUBAL LIGATION Bilateral 09/24/2017   Procedure: LAPAROSCOPIC BILATERAL TUBAL LIGATION;  Surgeon: Herold Harms, MD;  Location: [facility] OR[facility];  Service: Gynecology;  Laterality: Bilateral;  . LAPAROSCOPY N/A 09/24/2017   Procedure: LAPAROSCOPY DIAGNOSTIC;  Surgeon: Herold Harms, MD;  Location: [facility];  Service: Gynecology;  Laterality: N/A;    Family History  Problem Relation Age of Onset  . Hypertension Mother   . Diabetes Mother   . Breast cancer Neg Hx   . Ovarian cancer Neg Hx   . Colon cancer Neg Hx     Social History  Socioeconomic History  . Marital status: Married    Spouse name: Not on file  . Number of children: Not on file  . Years of education: Not on file  . Highest education level: Not on file  Occupational History  . Not on file  Social Needs  . Financial resource strain: Not on file  . Food insecurity:    Worry: Not on file    Inability: Not on file  . Transportation needs:    Medical: Not on file    Non-medical: Not on file  Tobacco Use  . Smoking status: Never Smoker  . Smokeless tobacco: Never Used  Substance and Sexual Activity  . Alcohol use: Yes    Comment: OCC  . Drug use: No  . Sexual activity: Yes    Birth control/protection: None  Lifestyle  .  Physical activity:    Days per week: Not on file    Minutes per session: Not on file  . Stress: Not on file  Relationships  . Social connections:    Talks on phone: Not on file    Gets together: Not on file    Attends religious service: Not on file    Active member of club or organization: Not on file    Attends meetings of clubs or organizations: Not on file    Relationship status: Not on file  . Intimate partner violence:    Fear of current or ex partner: Not on file    Emotionally abused: Not on file    Physically abused: Not on file    Forced sexual activity: Not on file  Other Topics Concern  . Not on file  Social History Narrative   Works in Location manager at [employer]   Married   The PMH, PSH, Social History, Family History, Medications, and allergies have been reviewed in [facility], and have been updated if relevant.   Review of Systems  Constitutional: Positive for fatigue.  HENT: Negative.   Eyes: Negative.   Respiratory: Negative.   Cardiovascular: Negative.   Gastrointestinal: Negative.   Endocrine: Negative.   Genitourinary: Positive for frequency. Negative for decreased urine volume, dysuria, flank pain, genital sores, hematuria, menstrual problem and urgency.  Musculoskeletal: Negative.   Allergic/Immunologic: Negative.   Neurological: Negative.   Hematological: Negative.   Psychiatric/Behavioral: Negative.   All other systems reviewed and are negative.      Objective:    BP 118/82 (BP Location: Left Arm, Patient Position: Sitting, Cuff Size: Normal)   Pulse 86   Temp 97.6 F (36.4 C) (Oral)   Ht 5' 1.75" (1.568 m)   Wt 200 lb 12.8 oz (91.1 kg)   LMP 05/28/2018   SpO2 99%   BMI 37.02 kg/m    Physical Exam   General:  Well-developed,well-nourished,in no acute distress; alert,appropriate and cooperative throughout examination Head:  normocephalic and atraumatic.   Eyes:  vision grossly intact, PERRL Ears:  R ear normal and L ear normal  externally, TMs clear bilaterally Nose:  no external deformity.   Mouth:  good dentition.   Neck:  No deformities, masses, or tenderness noted. Breasts:  No mass, nodules, thickening, tenderness, bulging, retraction, inflamation, nipple discharge or skin changes noted.   Lungs:  Normal respiratory effort, chest expands symmetrically. Lungs are clear to auscultation, no crackles or wheezes. Heart:  Normal rate and regular rhythm. S1 and S2 normal without gallop, murmur, click, rub or other extra sounds. Abdomen:  Bowel sounds positive,abdomen soft and non-tender without masses,  organomegaly or hernias noted. Msk:  No deformity or scoliosis noted of thoracic or lumbar spine.   Extremities:  No clubbing, cyanosis, edema, or deformity noted with normal full range of motion of all joints.   Neurologic:  alert & oriented X3 and gait normal.   Skin:  Intact without suspicious lesions or rashes Cervical Nodes:  No lymphadenopathy noted Axillary Nodes:  No palpable lymphadenopathy Psych:  Cognition and judgment appear intact. Alert and cooperative with normal attention span and concentration. No apparent delusions, illusions, hallucinations       Assessment & Plan:   Well woman exam without gynecological exam  Other specified hypothyroidism - Plan: CBC, TSH, T4, free  Hyperlipidemia, unspecified hyperlipidemia type - Plan: Comprehensive metabolic panel, Lipid panel  Vitamin D deficiency - Plan: Vitamin D (25 hydroxy)  Urinary frequency - Plan: POCT urinalysis dipstick No follow-ups on file.

## 2018-06-21 ENCOUNTER — Telehealth: Payer: Self-pay

## 2018-06-21 DIAGNOSIS — E559 Vitamin D deficiency, unspecified: Secondary | ICD-10-CM

## 2018-06-21 MED ORDER — VITAMIN D (ERGOCALCIFEROL) 1.25 MG (50000 UNIT) PO CAPS: 50000.0000 [IU] | ORAL_CAPSULE | ORAL | 0 refills | Status: AC

## 2018-06-21 NOTE — Telephone Encounter (Signed)
LMOVM stating that Vit-[place] 1qwk sent to pharm and take 1.6k IU qd after finishes the once weekly Rx/also stated that she needs to schedule a lab visit for 8-12 weeks out to recheck that level/thx dmf

## 2018-06-21 NOTE — Telephone Encounter (Signed)
Thank you.  Please send in Vit D3 50,000 units x 6 weeks - 1 tab po weekly x 8 weeks (number 8, no refills), then continue VitD 1600 IU daily.  Recheck in 8-12 weeks (268.0)

## 2018-06-21 NOTE — Telephone Encounter (Signed)
TA-I informed pt of labs/concerning her low vit-Zailynn Brandel she is not currently taking anything other that what may be in that prenatal vitamin she is taking/she said that I could inform her of what you would want her to do via a MyChart message/plz advise/thx dmf

## 2018-06-21 NOTE — Telephone Encounter (Signed)
-----   Message from Dianne Dunalia M Aron, MD sent at 06/21/2018 11:06 AM EDT ----- Please let pt know that her cholesterol, cbc, thyroid function, liver function, kidney function and electrolytes look excellent. Keep up the good work. Vit Yazmen Briones is low- is she currently taking any Vit Kateria Cutrona?

## 2018-06-24 ENCOUNTER — Encounter: Payer: Self-pay | Admitting: Family Medicine

## 2018-07-04 ENCOUNTER — Encounter: Payer: Self-pay | Admitting: Obstetrics and Gynecology

## 2018-07-04 ENCOUNTER — Ambulatory Visit: Payer: 59 | Admitting: Obstetrics and Gynecology

## 2018-07-04 VITALS — BP 103/66 | HR 76 | Ht 61.75 in | Wt 203.3 lb

## 2018-07-04 DIAGNOSIS — R102 Pelvic and perineal pain: Secondary | ICD-10-CM | POA: Diagnosis not present

## 2018-07-04 DIAGNOSIS — N921 Excessive and frequent menstruation with irregular cycle: Secondary | ICD-10-CM

## 2018-07-04 DIAGNOSIS — Z9889 Other specified postprocedural states: Secondary | ICD-10-CM | POA: Diagnosis not present

## 2018-07-04 DIAGNOSIS — N809 Endometriosis, unspecified: Secondary | ICD-10-CM

## 2018-07-04 DIAGNOSIS — E282 Polycystic ovarian syndrome: Secondary | ICD-10-CM | POA: Diagnosis not present

## 2018-07-04 MED ORDER — ELAGOLIX SODIUM 150 MG PO TABS: 150.0000 mg | ORAL_TABLET | Freq: Every day | ORAL | 6 refills | Status: DC

## 2018-07-04 NOTE — Patient Instructions (Signed)
1.  Symptomatic endometriosis was discussed in detail.  Abnormal uterine bleeding, pelvic pain, and bloating type symptoms may all be associated with endometriosis.  Birth control pills and Nexplanon did did not have significant impact on symptomatology. Alternatives of management have been reviewed including trial of oral Alyssa, Mirena IUD, Depo-Lupron, and definitive surgery through TAH plus or minus BSO. 2.  Begin Orilissa 150 mg daily 3.  Return in 4 weeks for follow-up 4.  Continue with prenatal vitamin with calcium and vitamin D and weightbearing exercise to minimize impact of the medication on bone health.

## 2018-07-04 NOTE — Progress Notes (Signed)
Chief complaint: 1.  Abnormal uterine bleeding 2.  Pelvic pain 3.  Bloating 4.  History of endometriosis  Kimberly Garner is a 32 year old white female para 1-0-0-1, status post primary low cervical transverse cesarean section delivery, status post laparoscopic bilateral salpingectomy and excision and fulguration of endometriosis, who presents for evaluation of above complaints.  Past history is notable for OCPs not having any impact on her endometriosis symptoms. Nexplanon did not have any positive impact on her endometriosis symptoms. Patient is not desiring future children as she has had bilateral salpingectomy.  Patient's pain is central cramping with a right-sided component and occasionally bilateral component.  Sex is uncomfortable.  She does have perimenstrual bloating.  Review of menstrual calendar monitor is notable for cycles ranging anywhere from 24 to 36-day intervals with duration of flow ranging anywhere from 5 to 10 days in duration.  Past Medical History:  Diagnosis Date  . Anemia    iron deficiency  . Breast lump 2014  . Family history of adverse reaction to anesthesia    MOM-HARD TO WAKE UP  . Headache    MIGRAINES  . Hypothyroidism   . Thyroid disease   . Vitamin D deficiency    Past Surgical History:  Procedure Laterality Date  . CESAREAN SECTION  2014  . CHOLECYSTECTOMY  2012  . FETAL BLOOD TRANSFUSION  2010  . FOOT SURGERY  2013  . HYSTEROSCOPY W/D&C N/A 09/24/2017   Procedure: DILATATION AND CURETTAGE /HYSTEROSCOPY;  Surgeon: Nyella Eckels, Prentice Docker, MD;  Location: [facility];  Service: Gynecology;  Laterality: N/A;  . LAPAROSCOPIC TUBAL LIGATION Bilateral 09/24/2017   Procedure: LAPAROSCOPIC BILATERAL TUBAL LIGATION;  Surgeon: Herold Harms, MD;  Location: [facility];  Service: Gynecology;  Laterality: Bilateral;  . LAPAROSCOPY N/A 09/24/2017   Procedure: LAPAROSCOPY DIAGNOSTIC;  Surgeon: Herold Harms, MD;  Location: [facility];  Service: Gynecology;   Laterality: N/A;   Review of systems: Complete review of systems is significant for that noted in the HPI.  Bowel function is normal.  Bladder function is normal.  OBJECTIVE: BP 103/66   Pulse 76   Ht 5' 1.75" (1.568 m)   Wt 203 lb 4.8 oz (92.2 kg)   LMP 06/20/2018   BMI 37.49 kg/m  Pleasant well-appearing female no acute distress.  Alert and oriented. Abdomen: Soft, nontender without organomegaly Back: No CVA tenderness Pelvic: External genitalia-normal BUS-normal Vagina-narrowed introitus; cervix is situated high in the vaginal vault with excellent support; pelvic sidewalls are narrowed; old menstrual blood is noted in the vault Cervix-no lesions; 1/4 cervical motion tenderness Uterus-midplane, normal si vitamin with calcium and vitamin D supplement ze and shape, tender 4/10, mobile Adnexa-right and left sides tender 6/10, nonpalpable Rectovaginal-normal external exam Bony pelvis-anthropoid  ASSESSMENT: 1.  Abnormal uterine bleeding/menorrhagia 2.  Pelvic pain secondary to endometriosis, midline and intermittently bilateral adnexal 3.  Dyspareunia 4.  Perimenstrual bloating and irritability consistent with PMS 5.  History of PCO-increased acne noted recently 6.  Not desiring future childbearing; status post bilateral salpingectomy 7.  Laparoscopically confirmed endometriosis  PLAN: 1.  Endometriosis medical management options have been reviewed and questions answered.  Birth control pills and Nexplanon has been ineffective.  Lupron, Dewayne Hatch, Mirena IUD, daily progestin therapy discussed.  Definitive surgical management includes TAH with USO or BSO. 2.  Patient is to go on trial of Orilissa 150 mg daily. 3.  Patient is to continue with prenatal vitamin including calcium and vitamin D, and is to be sure to continue with weightbearing exercise  to minimize medication risk of bone health. 4.  Return in 4 weeks for follow-up.  A total of 25 minutes were spent face-to-face with  the patient during this encounter and over half of that time involved counseling and coordination of care.  Herold Harms, MD  Note: This dictation was prepared with Dragon dictation along with smaller phrase technology. Any transcriptional errors that result from this process are unintentional.

## 2018-07-17 ENCOUNTER — Encounter: Payer: 59 | Admitting: Family Medicine

## 2018-08-01 ENCOUNTER — Encounter: Payer: 59 | Admitting: Obstetrics and Gynecology

## 2018-08-06 ENCOUNTER — Other Ambulatory Visit (INDEPENDENT_AMBULATORY_CARE_PROVIDER_SITE_OTHER): Payer: 59

## 2018-08-06 ENCOUNTER — Encounter: Payer: 59 | Admitting: Obstetrics and Gynecology

## 2018-08-06 DIAGNOSIS — E559 Vitamin D deficiency, unspecified: Secondary | ICD-10-CM

## 2018-08-06 LAB — VITAMIN D 25 HYDROXY (VIT D DEFICIENCY, FRACTURES): VITD: 41.11 ng/mL (ref 30.00–100.00)

## 2018-08-15 ENCOUNTER — Encounter: Payer: Self-pay | Admitting: Obstetrics and Gynecology

## 2018-08-15 ENCOUNTER — Ambulatory Visit: Payer: 59 | Admitting: Obstetrics and Gynecology

## 2018-08-15 VITALS — BP 115/70 | HR 84 | Ht 61.0 in | Wt 203.6 lb

## 2018-08-15 DIAGNOSIS — N809 Endometriosis, unspecified: Secondary | ICD-10-CM

## 2018-08-15 DIAGNOSIS — Z9889 Other specified postprocedural states: Secondary | ICD-10-CM

## 2018-08-15 NOTE — Progress Notes (Signed)
Chief complaint: 1.  Endometriosis 2.  Medication management  Kimberly Garner presents today for 4-week follow-up after starting Orilissa therapy.  She has noted dramatic improvement in her pelvic pain.  She had slight twinges of discomfort on the first day of her menses, primarily right-sided, after which minimal cramping was noted over the next several days.  She did not require any medication.  She did use a heating pad to help with symptoms.  Off of her cycle she has not experienced any pain.  Past medical history, past surgical history, problem list, medications, and allergies are reviewed  OBJECTIVE: BP 115/70   Pulse 84   Ht 5\' 1"  (1.549 m)   Wt 203 lb 9.6 oz (92.4 kg)   LMP 07/28/2018 (Approximate)   BMI 38.47 kg/m  Pleasant female no acute distress.  Alert and oriented. Abdomen: Soft, nontender; no hernias Pelvic exam: External genitalia-normal BUS-normal Bimanual-cervical motion tenderness 1/10; uterus tenderness 1/10; left adnexa tenderness 2/10; right adnexa tenderness 3/10  ASSESSMENT: 1.  Endometriosis, minimally symptomatic with market improvement on Orilissa 150 mg daily; no analgesic therapy required  PLAN: 1.  Continue Orilissa 150 mg daily 2.  Monitor for pain and abnormal uterine bleeding 3.  Return in 3 months to see Dr. Logan Bores in follow-up 4.  Patient is contemplating definitive surgery in the spring 2020  A total of 15 minutes were spent face-to-face with the patient during this encounter and over half of that time dealt with counseling and coordination of care.  Herold Harms, MD  Note: This dictation was prepared with Dragon dictation along with smaller phrase technology. Any transcriptional errors that result from this process are unintentional.

## 2018-08-15 NOTE — Patient Instructions (Signed)
1.  Continue with the Orilissa 150 mg daily. 2.  Return in 3 months for follow-up. 3.  Return sooner if any unusual side effects or abnormal bleeding develops.

## 2018-08-28 ENCOUNTER — Encounter: Payer: 59 | Admitting: Family Medicine

## 2018-08-30 ENCOUNTER — Other Ambulatory Visit: Payer: Self-pay | Admitting: Family Medicine

## 2018-11-15 ENCOUNTER — Encounter: Payer: 59 | Admitting: Obstetrics and Gynecology

## 2018-11-15 ENCOUNTER — Ambulatory Visit: Payer: 59 | Admitting: Obstetrics and Gynecology

## 2018-11-15 ENCOUNTER — Encounter: Payer: Self-pay | Admitting: Obstetrics and Gynecology

## 2018-11-15 NOTE — Progress Notes (Signed)
Patient was not seen due to Dr. Logan Bores being called into surgery. Rescheduled for next week.

## 2018-11-19 ENCOUNTER — Ambulatory Visit (INDEPENDENT_AMBULATORY_CARE_PROVIDER_SITE_OTHER): Payer: 59 | Admitting: Obstetrics and Gynecology

## 2018-11-19 ENCOUNTER — Encounter: Payer: Self-pay | Admitting: Obstetrics and Gynecology

## 2018-11-19 VITALS — BP 112/77 | HR 86 | Ht 62.0 in | Wt 204.1 lb

## 2018-11-19 DIAGNOSIS — N809 Endometriosis, unspecified: Secondary | ICD-10-CM | POA: Diagnosis not present

## 2018-11-19 DIAGNOSIS — N946 Dysmenorrhea, unspecified: Secondary | ICD-10-CM | POA: Diagnosis not present

## 2018-11-19 DIAGNOSIS — R102 Pelvic and perineal pain: Secondary | ICD-10-CM | POA: Diagnosis not present

## 2018-11-19 NOTE — Progress Notes (Signed)
Patient comes in today for follow up for endometriosis. She was taking [place] and has stooped. She would like to know if there is something else she can use.

## 2018-11-19 NOTE — Progress Notes (Signed)
HPI:      Kimberly Garner is a 33 y.o. G1P1001 who LMP was No LMP recorded.  Subjective:   She presents today for follow-up of endometriosis.  She has been using [place] for approximately 6 months and has discontinued it on her own.  She believes it was causing headaches although it did take away her pelvic pain.  She does not want to restart it. She is looking for other options to treat her endometriosis.  She has previously been made aware that surgery is an option but she would like nonsurgical options if possible.  She previously had Nexplanon and did not like it.  She has previously tried OCPs with little success.    Hx: The following portions of the patient's history were reviewed and updated as appropriate:             She  has a past medical history of Anemia, Breast lump (2014), Family history of adverse reaction to anesthesia, Headache, Hypothyroidism, Thyroid disease, and Vitamin D deficiency. She does not have any pertinent problems on file. She  has a past surgical history that includes Cesarean section (2014); Fetal blood transfusion (2010); Cholecystectomy (2012); Foot surgery (2013); Hysteroscopy w/D&C (N/A, 09/24/2017); laparoscopy (N/A, 09/24/2017); and Laparoscopic tubal ligation (Bilateral, 09/24/2017). Her family history includes Diabetes in her mother; Hypertension in her mother. She  reports that she has never smoked. She has never used smokeless tobacco. She reports current alcohol use. She reports that she does not use drugs. She has a current medication list which includes the following prescription(s): albuterol, elagolix sodium, ibuprofen, levothyroxine, and prenatal multivit-min-fe-fa. She has No Known Allergies.       Review of Systems:  Review of Systems  Constitutional: Denied constitutional symptoms, night sweats, recent illness, fatigue, fever, insomnia and weight loss.  Eyes: Denied eye symptoms, eye pain, photophobia, vision change and visual disturbance.   Ears/Nose/Throat/Neck: Denied ear, nose, throat or neck symptoms, hearing loss, nasal discharge, sinus congestion and sore throat.  Cardiovascular: Denied cardiovascular symptoms, arrhythmia, chest pain/pressure, edema, exercise intolerance, orthopnea and palpitations.  Respiratory: Denied pulmonary symptoms, asthma, pleuritic pain, productive sputum, cough, dyspnea and wheezing.  Gastrointestinal: Denied, gastro-esophageal reflux, melena, nausea and vomiting.  Genitourinary: See HPI for additional information.  Musculoskeletal: Denied musculoskeletal symptoms, stiffness, swelling, muscle weakness and myalgia.  Dermatologic: Denied dermatology symptoms, rash and scar.  Neurologic: Denied neurology symptoms, dizziness, headache, neck pain and syncope.  Psychiatric: Denied psychiatric symptoms, anxiety and depression.  Endocrine: Denied endocrine symptoms including hot flashes and night sweats.   Meds:   Current Outpatient Medications on File Prior to Visit  Medication Sig Dispense Refill  . albuterol (VENTOLIN HFA) 108 (90 Base) MCG/ACT inhaler Inhale 2 puffs into the lungs every 6 (six) hours as needed for wheezing or shortness of breath. 8 g 1  . Elagolix Sodium (ORILISSA) 150 MG TABS Take 150 mg by mouth daily. 30 tablet 6  . ibuprofen (ADVIL,MOTRIN) 800 MG tablet Take 1 tablet (800 mg total) by mouth 3 (three) times daily. 30 tablet 1  . levothyroxine (SYNTHROID, LEVOTHROID) 75 MCG tablet TAKE 1 TABLET(75 MCG) BY MOUTH DAILY BEFORE BREAKFAST 90 tablet 0  . Prenatal Multivit-Min-Fe-FA (PRENATAL VITAMINS PO) Take 1 tablet by mouth daily.     No current facility-administered medications on file prior to visit.     Objective:     Vitals:   11/19/18 1452  BP: 112/77  Pulse: 86  Assessment:    G1P1001 Patient Active Problem List   Diagnosis Date Noted  . Pelvic pain 07/04/2018  . Well woman exam without gynecological exam 06/20/2018  . Vitamin D deficiency  06/20/2018  . Urinary frequency 06/20/2018  . Endometriosis determined by laparoscopy 09/24/2017  . Status post laparoscopic bilateral salpingectomy/excision and fulguration of endometriosis 09/24/2017  . PCO (polycystic ovaries) 08/14/2017  . Status post hysteroscopy/D&C 08/14/2017  . Obesity (BMI 35.0-39.9 without comorbidity) 08/14/2017  . History of blood transfusion 08/14/2017  . Menorrhagia with irregular cycle 08/14/2017  . CHOLELITHIASIS 10/10/2010  . HYPERTRIGLYCERIDEMIA 09/08/2010  . HLD (hyperlipidemia) 07/28/2010  . Other specified hypothyroidism 04/25/2010  . ANEMIA-IRON DEFICIENCY 04/25/2010  . FATIGUE 04/25/2010  . ELEVATED BP READING WITHOUT DX HYPERTENSION 04/25/2010  . IRON DEFICIENCY ANEMIA, HX OF 04/25/2010     1. Endometriosis   2. Pelvic pain in female   3. Dysmenorrhea     Patient discontinued Dewayne Hatch because of headaches and does not want to restart.  She is looking for other nonsurgical options to treat endometriosis. After long discussion it seems like she may also have adenomyosis as part of her issue because the pelvic cramping and 8-day menses are her main problems.   Plan:            1.  Have discussed multiple options including medical and surgical.  Risk benefits of each discussed in detail.  All of her questions answered. IUD Literature on Mirena given.  Risks and benefits discussed.  She is considering IUD as an option for cycle control and help with her endometriosis pain. She has chosen IUD for treatment of her pelvic cramping heavy bleeding and endometriosis/adenomyosis.  Orders No orders of the defined types were placed in this encounter.   No orders of the defined types were placed in this encounter.     F/U  Return for She is to call at the start of next menses. I spent 20 minutes involved in the care of this patient of which greater than 50% was spent discussing endometriosis, adenomyosis, multiple medications and multiple types of  surgery for endometriosis the risks and benefits of each being reviewed.  All of her questions were answered.  Elonda Husky, M.D. 11/19/2018 3:20 PM

## 2018-11-30 ENCOUNTER — Other Ambulatory Visit: Payer: Self-pay | Admitting: Family Medicine

## 2018-12-12 ENCOUNTER — Ambulatory Visit (INDEPENDENT_AMBULATORY_CARE_PROVIDER_SITE_OTHER): Payer: 59 | Admitting: Obstetrics and Gynecology

## 2018-12-12 ENCOUNTER — Encounter: Payer: Self-pay | Admitting: Obstetrics and Gynecology

## 2018-12-12 VITALS — BP 109/73 | HR 85 | Ht 62.0 in | Wt 204.5 lb

## 2018-12-12 DIAGNOSIS — Z30430 Encounter for insertion of intrauterine contraceptive device: Secondary | ICD-10-CM

## 2018-12-12 NOTE — Progress Notes (Signed)
HPI:      [name] is a 33 y.o. G1P1001 who LMP was Patient's last menstrual period was 12/11/2018.  Subjective:   She presents today for IUD insertion.  She has a history of endometriosis and has failed [place].    Hx: The following portions of the patient's history were reviewed and updated as appropriate:             She  has a past medical history of Anemia, Breast lump (2014), Family history of adverse reaction to anesthesia, Headache, Hypothyroidism, Thyroid disease, and Vitamin D deficiency. She does not have any pertinent problems on file. She  has a past surgical history that includes Cesarean section (2014); Fetal blood transfusion (2010); Cholecystectomy (2012); Foot surgery (2013); Hysteroscopy w/D&C (N/A, 09/24/2017); laparoscopy (N/A, 09/24/2017); and Laparoscopic tubal ligation (Bilateral, 09/24/2017). Her family history includes Diabetes in her mother; Hypertension in her mother. She  reports that she has never smoked. She has never used smokeless tobacco. She reports current alcohol use. She reports that she does not use drugs. She has a current medication list which includes the following prescription(s): albuterol, vitamin d, ibuprofen, levonorgestrel, levothyroxine, prenatal vit-fe fumarate-fa, and elagolix sodium. She has No Known Allergies.       Review of Systems:  Review of Systems  Constitutional: Denied constitutional symptoms, night sweats, recent illness, fatigue, fever, insomnia and weight loss.  Eyes: Denied eye symptoms, eye pain, photophobia, vision change and visual disturbance.  Ears/Nose/Throat/Neck: Denied ear, nose, throat or neck symptoms, hearing loss, nasal discharge, sinus congestion and sore throat.  Cardiovascular: Denied cardiovascular symptoms, arrhythmia, chest pain/pressure, edema, exercise intolerance, orthopnea and palpitations.  Respiratory: Denied pulmonary symptoms, asthma, pleuritic pain, productive sputum, cough, dyspnea and  wheezing.  Gastrointestinal: Denied, gastro-esophageal reflux, melena, nausea and vomiting.  Genitourinary: Denied genitourinary symptoms including symptomatic vaginal discharge, pelvic relaxation issues, and urinary complaints.  Musculoskeletal: Denied musculoskeletal symptoms, stiffness, swelling, muscle weakness and myalgia.  Dermatologic: Denied dermatology symptoms, rash and scar.  Neurologic: Denied neurology symptoms, dizziness, headache, neck pain and syncope.  Psychiatric: Denied psychiatric symptoms, anxiety and depression.  Endocrine: Denied endocrine symptoms including hot flashes and night sweats.   Meds:   Current Outpatient Medications on File Prior to Visit  Medication Sig Dispense Refill  . albuterol (VENTOLIN HFA) 108 (90 Base) MCG/ACT inhaler Inhale 2 puffs into the lungs every 6 (six) hours as needed for wheezing or shortness of breath. 8 g 1  . Cholecalciferol (VITAMIN D) 50 MCG (2000 UT) tablet Take 2,000 Units by mouth daily.    Marland Kitchen ibuprofen (ADVIL,MOTRIN) 800 MG tablet Take 1 tablet (800 mg total) by mouth 3 (three) times daily. 30 tablet 1  . levonorgestrel (MIRENA) 20 MCG/24HR IUD 1 each by Intrauterine route once.    Marland Kitchen levothyroxine (SYNTHROID, LEVOTHROID) 75 MCG tablet TAKE 1 TABLET(75 MCG) BY MOUTH DAILY BEFORE BREAKFAST 90 tablet 0  . Prenatal Multivit-Min-Fe-FA (PRENATAL VITAMINS PO) Take 1 tablet by mouth daily.    . Elagolix Sodium (ORILISSA) 150 MG TABS Take 150 mg by mouth daily. 30 tablet 6   No current facility-administered medications on file prior to visit.     Objective:     Vitals:   12/12/18 0902  BP: 109/73  Pulse: 85    Physical examination   Pelvic:   Vulva: Normal appearance.  No lesions.  Vagina: No lesions or abnormalities noted.  Support: Normal pelvic support.  Urethra No masses tenderness or scarring.  Meatus Normal size without lesions or  prolapse.  Cervix: Normal appearance.  No lesions.  Anus: Normal exam.  No lesions.   Perineum: Normal exam.  No lesions.        Bimanual   Uterus: Normal size.  Non-tender.  Mobile.  AV.  Adnexae: No masses.  Non-tender to palpation.  Cul-de-sac: Negative for abnormality.   IUD Procedure Pt has read the booklet and signed the appropriate forms regarding the Mirena IUD.  All of her questions have been answered.   The cervix was cleansed with betadine solution.  After sounding the uterus and noting the position, the IUD was placed in the usual manner without problem.  The string was cut to the appropriate length.  The patient tolerated the procedure well.              Assessment:    G1P1001 Patient Active Problem List   Diagnosis Date Noted  . Pelvic pain 07/04/2018  . Well woman exam without gynecological exam 06/20/2018  . Vitamin D deficiency 06/20/2018  . Urinary frequency 06/20/2018  . Endometriosis determined by laparoscopy 09/24/2017  . Status post laparoscopic bilateral salpingectomy/excision and fulguration of endometriosis 09/24/2017  . PCO (polycystic ovaries) 08/14/2017  . Status post hysteroscopy/D&C 08/14/2017  . Obesity (BMI 35.0-39.9 without comorbidity) 08/14/2017  . History of blood transfusion 08/14/2017  . Menorrhagia with irregular cycle 08/14/2017  . CHOLELITHIASIS 10/10/2010  . HYPERTRIGLYCERIDEMIA 09/08/2010  . HLD (hyperlipidemia) 07/28/2010  . Other specified hypothyroidism 04/25/2010  . ANEMIA-IRON DEFICIENCY 04/25/2010  . FATIGUE 04/25/2010  . ELEVATED BP READING WITHOUT DX HYPERTENSION 04/25/2010  . IRON DEFICIENCY ANEMIA, HX OF 04/25/2010     1. Encounter for insertion of mirena IUD     History of endometriosis  Plan:             F/U  Return in about 4 weeks (around 01/09/2019) for For IUD f/u.  Elonda Huskyavid J. Evans, M.D. 12/12/2018 9:40 AM

## 2018-12-12 NOTE — Progress Notes (Signed)
Pt is present today for IUD insertion. Pt stated that she started her cycle yesterday and is doing well.

## 2019-01-07 ENCOUNTER — Encounter: Payer: Self-pay | Admitting: Family Medicine

## 2019-01-09 ENCOUNTER — Encounter: Payer: Self-pay | Admitting: Obstetrics and Gynecology

## 2019-01-09 ENCOUNTER — Other Ambulatory Visit: Payer: Self-pay

## 2019-01-09 ENCOUNTER — Ambulatory Visit: Payer: 59 | Admitting: Obstetrics and Gynecology

## 2019-01-09 VITALS — BP 107/69 | HR 75 | Ht 61.0 in | Wt 199.3 lb

## 2019-01-09 DIAGNOSIS — Z30431 Encounter for routine checking of intrauterine contraceptive device: Secondary | ICD-10-CM

## 2019-01-09 NOTE — Progress Notes (Signed)
HPI:      Kimberly Garner is a 33 y.o. G1P1001 who LMP was Patient's last menstrual period was 01/07/2019.  Subjective:   She presents today for follow-up of her IUD.  She has had a few days of spotting but otherwise doing well.  She has had intercourse without issue.    Hx: The following portions of the patient's history were reviewed and updated as appropriate:             She  has a past medical history of Anemia, Breast lump (2014), Family history of adverse reaction to anesthesia, Headache, Hypothyroidism, Thyroid disease, and Vitamin D deficiency. She does not have any pertinent problems on file. She  has a past surgical history that includes Cesarean section (2014); Fetal blood transfusion (2010); Cholecystectomy (2012); Foot surgery (2013); Hysteroscopy w/D&C (N/A, 09/24/2017); laparoscopy (N/A, 09/24/2017); and Laparoscopic tubal ligation (Bilateral, 09/24/2017). Her family history includes Diabetes in her mother; Hypertension in her mother. She  reports that she has never smoked. She has never used smokeless tobacco. She reports current alcohol use. She reports that she does not use drugs. She has a current medication list which includes the following prescription(s): albuterol, vitamin d, elagolix sodium, ibuprofen, levonorgestrel, levothyroxine, and prenatal vit-fe fumarate-fa. She has No Known Allergies.       Review of Systems:  Review of Systems  Constitutional: Denied constitutional symptoms, night sweats, recent illness, fatigue, fever, insomnia and weight loss.  Eyes: Denied eye symptoms, eye pain, photophobia, vision change and visual disturbance.  Ears/Nose/Throat/Neck: Denied ear, nose, throat or neck symptoms, hearing loss, nasal discharge, sinus congestion and sore throat.  Cardiovascular: Denied cardiovascular symptoms, arrhythmia, chest pain/pressure, edema, exercise intolerance, orthopnea and palpitations.  Respiratory: Denied pulmonary symptoms, asthma, pleuritic  pain, productive sputum, cough, dyspnea and wheezing.  Gastrointestinal: Denied, gastro-esophageal reflux, melena, nausea and vomiting.  Genitourinary: Denied genitourinary symptoms including symptomatic vaginal discharge, pelvic relaxation issues, and urinary complaints.  Musculoskeletal: Denied musculoskeletal symptoms, stiffness, swelling, muscle weakness and myalgia.  Dermatologic: Denied dermatology symptoms, rash and scar.  Neurologic: Denied neurology symptoms, dizziness, headache, neck pain and syncope.  Psychiatric: Denied psychiatric symptoms, anxiety and depression.  Endocrine: Denied endocrine symptoms including hot flashes and night sweats.   Meds:   Current Outpatient Medications on File Prior to Visit  Medication Sig Dispense Refill  . albuterol (VENTOLIN HFA) 108 (90 Base) MCG/ACT inhaler Inhale 2 puffs into the lungs every 6 (six) hours as needed for wheezing or shortness of breath. 8 g 1  . Cholecalciferol (VITAMIN D) 50 MCG (2000 UT) tablet Take 2,000 Units by mouth daily.    . Elagolix Sodium (ORILISSA) 150 MG TABS Take 150 mg by mouth daily. 30 tablet 6  . ibuprofen (ADVIL,MOTRIN) 800 MG tablet Take 1 tablet (800 mg total) by mouth 3 (three) times daily. 30 tablet 1  . levonorgestrel (MIRENA) 20 MCG/24HR IUD 1 each by Intrauterine route once.    Marland Kitchen levothyroxine (SYNTHROID, LEVOTHROID) 75 MCG tablet TAKE 1 TABLET(75 MCG) BY MOUTH DAILY BEFORE BREAKFAST 90 tablet 0  . Prenatal Multivit-Min-Fe-FA (PRENATAL VITAMINS PO) Take 1 tablet by mouth daily.     No current facility-administered medications on file prior to visit.     Objective:     Vitals:   01/09/19 1055  BP: 107/69  Pulse: 75              Physical examination   Pelvic:   Vulva: Normal appearance.  No lesions.  Vagina: No lesions or  abnormalities noted.  Support: Normal pelvic support.  Urethra No masses tenderness or scarring.  Meatus Normal size without lesions or prolapse.  Cervix: Normal  appearance.  No lesions.  IUD strings not seen initially but found just within the endocervical os.    Anus: Normal exam.  No lesions.  Perineum: Normal exam.  No lesions.        Bimanual   Uterus: Normal size.  Non-tender.  Mobile.  AV.  Adnexae: No masses.  Non-tender to palpation.  Cul-de-sac: Negative for abnormality.     Assessment:    G1P1001 Patient Active Problem List   Diagnosis Date Noted  . Pelvic pain 07/04/2018  . Well woman exam without gynecological exam 06/20/2018  . Vitamin D deficiency 06/20/2018  . Urinary frequency 06/20/2018  . Endometriosis determined by laparoscopy 09/24/2017  . Status post laparoscopic bilateral salpingectomy/excision and fulguration of endometriosis 09/24/2017  . PCO (polycystic ovaries) 08/14/2017  . Status post hysteroscopy/D&C 08/14/2017  . Obesity (BMI 35.0-39.9 without comorbidity) 08/14/2017  . History of blood transfusion 08/14/2017  . Menorrhagia with irregular cycle 08/14/2017  . CHOLELITHIASIS 10/10/2010  . HYPERTRIGLYCERIDEMIA 09/08/2010  . HLD (hyperlipidemia) 07/28/2010  . Other specified hypothyroidism 04/25/2010  . ANEMIA-IRON DEFICIENCY 04/25/2010  . FATIGUE 04/25/2010  . ELEVATED BP READING WITHOUT DX HYPERTENSION 04/25/2010  . IRON DEFICIENCY ANEMIA, HX OF 04/25/2010     1. Surveillance of previously prescribed intrauterine contraceptive device        Plan:            1.  Follow-up for annual examination in December.  Patient needs a Pap at that time. Orders No orders of the defined types were placed in this encounter.   No orders of the defined types were placed in this encounter.     F/U  Return in about 9 months (around 10/11/2019) for Annual Physical. I spent 16 minutes involved in the care of this patient of which greater than 50% was spent discussing IUD, breakthrough bleeding with IUD, endometriosis and IUD.  All questions answered. Elonda Husky, M.D. 01/09/2019 11:40 AM

## 2019-01-09 NOTE — Progress Notes (Signed)
Patient is coming in today for an IUD check. No concerns today.

## 2019-02-26 ENCOUNTER — Other Ambulatory Visit: Payer: Self-pay | Admitting: Family Medicine

## 2019-03-10 DIAGNOSIS — H5213 Myopia, bilateral: Secondary | ICD-10-CM | POA: Diagnosis not present

## 2019-03-20 ENCOUNTER — Telehealth (INDEPENDENT_AMBULATORY_CARE_PROVIDER_SITE_OTHER): Payer: 59 | Admitting: Family Medicine

## 2019-03-20 ENCOUNTER — Encounter: Payer: Self-pay | Admitting: Family Medicine

## 2019-03-20 DIAGNOSIS — M5416 Radiculopathy, lumbar region: Secondary | ICD-10-CM | POA: Diagnosis not present

## 2019-03-20 MED ORDER — PREDNISONE 10 MG PO TABS: ORAL_TABLET | ORAL | 0 refills | Status: DC

## 2019-03-20 NOTE — Assessment & Plan Note (Signed)
  ASSESSMENT:  lumbar strain with radiculopathy- currently no red flag symptoms.  PLAN: For acute pain, rest, intermittent application of cold packs (later, may switch to heat, but do not sleep on heating pad) as she has been doing.  Will send in short course of prednisone- advised NOT taking with other NSAIDS but okay to take tylenol.  Advised to take prednisone in the morning and with food.   Dscussed a home back care exercise program with flexion exercise routine. Proper lifting with avoidance of heavy lifting discussed. Consider Physical Therapy and XRay studies if not improving. Call or return to clinic prn if these symptoms worsen or fail to improve as anticipated. The patient indicates understanding of these issues and agrees with the plan.

## 2019-03-20 NOTE — Progress Notes (Signed)
Virtual Visit via Video   Due to the COVID-19 pandemic, this visit was completed with telemedicine (audio/video) technology to reduce patient and provider exposure as well as to preserve personal protective equipment.   I connected with Kimberly Garner by a video enabled telemedicine application and verified that I am speaking with the correct person using two identifiers. Location patient: Home Location provider: [facility], Office Persons participating in the virtual visit: Kimberly Garner, Ruthe Mannanalia Kimberly Rizzolo, MD   I discussed the limitations of evaluation and management by telemedicine and the availability of in person appointments. The patient expressed understanding and agreed to proceed.  Care Team   Patient Care Team: Dianne DunAron, Jana Swartzlander M, MD as PCP - General Kieth BrightlySankar, Seeplaputhur G, MD (General Surgery)  Subjective:   HPI:   Low back pain- injury occured 5 days ago.  In the bathroom, she stood up from a seated position and a sharp pain shot through her back and radiated down to both ankles.  She has some dull low back pain which she has been treating with alternating ice and heat along with Ibuprofen 600 mg twice daily .  Right now, pain is 3/10 on pain scale.   No LE weakness.  Still does not have FROM but it is getting better.  She is still unable to end down to put her shoes on.   Review of Systems  Constitutional: Negative.   HENT: Negative.   Eyes: Negative.   Respiratory: Negative.   Cardiovascular: Negative.   Gastrointestinal: Negative.   Genitourinary: Negative.   Musculoskeletal: Positive for back pain. Negative for falls, joint pain, myalgias and neck pain.  Skin: Negative.   Neurological: Positive for tingling. Negative for sensory change, speech change, focal weakness, seizures, loss of consciousness and weakness.  Endo/Heme/Allergies: Negative.   Psychiatric/Behavioral: Negative.   All other systems reviewed and are negative.    Patient Active Problem List   Diagnosis Date Noted  . Pelvic pain 07/04/2018  . Well woman exam without gynecological exam 06/20/2018  . Vitamin D deficiency 06/20/2018  . Urinary frequency 06/20/2018  . Endometriosis determined by laparoscopy 09/24/2017  . Status post laparoscopic bilateral salpingectomy/excision and fulguration of endometriosis 09/24/2017  . PCO (polycystic ovaries) 08/14/2017  . Status post hysteroscopy/D&C 08/14/2017  . Obesity (BMI 35.0-39.9 without comorbidity) 08/14/2017  . History of blood transfusion 08/14/2017  . Menorrhagia with irregular cycle 08/14/2017  . CHOLELITHIASIS 10/10/2010  . HYPERTRIGLYCERIDEMIA 09/08/2010  . HLD (hyperlipidemia) 07/28/2010  . Other specified hypothyroidism 04/25/2010  . ANEMIA-IRON DEFICIENCY 04/25/2010  . FATIGUE 04/25/2010  . ELEVATED BP READING WITHOUT DX HYPERTENSION 04/25/2010  . IRON DEFICIENCY ANEMIA, HX OF 04/25/2010    Social History   Tobacco Use  . Smoking status: Never Smoker  . Smokeless tobacco: Never Used  Substance Use Topics  . Alcohol use: Yes    Comment: OCC    Current Outpatient Medications:  .  albuterol (VENTOLIN HFA) 108 (90 Base) MCG/ACT inhaler, Inhale 2 puffs into the lungs every 6 (six) hours as needed for wheezing or shortness of breath., Disp: 8 g, Rfl: 1 .  Cholecalciferol (VITAMIN D) 50 MCG (2000 UT) tablet, Take 2,000 Units by mouth daily., Disp: , Rfl:  .  Elagolix Sodium (ORILISSA) 150 MG TABS, Take 150 mg by mouth daily., Disp: 30 tablet, Rfl: 6 .  ibuprofen (ADVIL,MOTRIN) 800 MG tablet, Take 1 tablet (800 mg total) by mouth 3 (three) times daily., Disp: 30 tablet, Rfl: 1 .  levonorgestrel (MIRENA) 20 MCG/24HR IUD, 1  each by Intrauterine route once., Disp: , Rfl:  .  levothyroxine (SYNTHROID) 75 MCG tablet, TAKE 1 TABLET(75 MCG) BY MOUTH DAILY BEFORE BREAKFAST, Disp: 90 tablet, Rfl: 0 .  Prenatal Multivit-Min-Fe-FA (PRENATAL VITAMINS PO), Take 1 tablet by mouth daily., Disp: , Rfl:   No Known Allergies   Objective:  Temp 98.2 F (36.8 C) (Oral)   Wt 198 lb (89.8 kg)   LMP 02/22/2019   BMI 37.41 kg/m   VITALS: Per patient if applicable, see vitals. GENERAL: Alert, appears well and in no acute distress. HEENT: Atraumatic, conjunctiva clear, no obvious abnormalities on inspection of external nose and ears. NECK: Normal movements of the head and neck. CARDIOPULMONARY: No increased WOB. Speaking in clear sentences. I:E ratio WNL.  MS: Moves all visible extremities without noticeable abnormality.  Painful and reduced LS ROM noted. Straight leg raise is positive at 45 degrees on right. DTR's, motor strength and sensation normal, including heel and toe gait.  Peripheral pulses are palpable. Lumbar spine X-Ray: not indicated.   PSYCH: Pleasant and cooperative, well-groomed. Speech normal rate and rhythm. Affect is appropriate. Insight and judgement are appropriate. Attention is focused, linear, and appropriate.  NEURO: CN grossly intact. Oriented as arrived to appointment on time with no prompting. Moves both UE equally.  SKIN: No obvious lesions, wounds, erythema, or cyanosis noted on face or hands.  Depression screen [facility] 2/9 06/20/2018 06/20/2018 09/29/2015  Decreased Interest 0 0 0  Down, Depressed, Hopeless 0 0 0  PHQ - 2 Score 0 0 0  Altered sleeping 2 - -  Tired, decreased energy 2 - -  Change in appetite 0 - -  Feeling bad or failure about yourself  0 - -  Trouble concentrating 0 - -  Moving slowly or fidgety/restless 0 - -  Suicidal thoughts 0 - -  PHQ-9 Score 4 - -  Difficult doing work/chores Somewhat difficult - -    Assessment and Plan:   There are no diagnoses linked to this encounter.  Marland Kitchen COVID-19 Education: The signs and symptoms of COVID-19 were discussed with the patient and how to seek care for testing if needed. The importance of social distancing was discussed today. . Reviewed expectations re: course of current medical issues. . Discussed self-management of  symptoms. . Outlined signs and symptoms indicating need for more acute intervention. . Patient verbalized understanding and all questions were answered. Marland Kitchen Health Maintenance issues including appropriate healthy diet, exercise, and smoking avoidance were discussed with patient. . See orders for this visit as documented in the electronic medical record.  Ruthe Mannan, MD  Records requested if needed. Time spent: 15 minutes, of which >50% was spent in obtaining information about her symptoms, reviewing her previous labs, evaluations, and treatments, counseling her about her condition (please see the discussed topics above), and developing a plan to further investigate it; she had a number of questions which I addressed.

## 2019-05-30 ENCOUNTER — Other Ambulatory Visit: Payer: Self-pay | Admitting: Family Medicine

## 2019-06-20 ENCOUNTER — Telehealth: Payer: Self-pay

## 2019-06-20 NOTE — Telephone Encounter (Signed)
Questions for Screening COVID-19  Symptom onset: None  Travel or Contacts: None  During this illness, did/does the patient experience any of the following symptoms? Fever >100.63F []   Yes [x]   No []   Unknown Subjective fever (felt feverish) []   Yes [x]   No []   Unknown Chills []   Yes [x]   No []   Unknown Muscle aches (myalgia) []   Yes [x]   No []   Unknown Runny nose (rhinorrhea) []   Yes [x]   No []   Unknown Sore throat []   Yes [x]   No []   Unknown Cough (new onset or worsening of chronic cough) []   Yes [x]   No []   Unknown Shortness of breath (dyspnea) []   Yes [x]   No []   Unknown Nausea or vomiting []   Yes [x]   No []   Unknown Headache []   Yes [x]   No []   Unknown Abdominal pain  []   Yes [x]   No []   Unknown Diarrhea (?3 loose/looser than normal stools/24hr period) []   Yes [x]   No []   Unknown Other, specify:  Patient risk factors: Smoker? []   Current []   Former []   Never If female, currently pregnant? []   Yes []   No  Patient Active Problem List   Diagnosis Date Noted  . Lumbar radiculopathy, acute 03/20/2019  . Vitamin Mikki Ziff deficiency 06/20/2018  . Endometriosis determined by laparoscopy 09/24/2017  . Status post laparoscopic bilateral salpingectomy/excision and fulguration of endometriosis 09/24/2017  . PCO (polycystic ovaries) 08/14/2017  . Status post hysteroscopy/Shambhavi Salley&C 08/14/2017  . Obesity (BMI 35.0-39.9 without comorbidity) 08/14/2017  . History of blood transfusion 08/14/2017  . Menorrhagia with irregular cycle 08/14/2017  . CHOLELITHIASIS 10/10/2010  . HYPERTRIGLYCERIDEMIA 09/08/2010  . HLD (hyperlipidemia) 07/28/2010  . Other specified hypothyroidism 04/25/2010  . ANEMIA-IRON DEFICIENCY 04/25/2010  . FATIGUE 04/25/2010  . ELEVATED BP READING WITHOUT DX HYPERTENSION 04/25/2010  . IRON DEFICIENCY ANEMIA, HX OF 04/25/2010    Plan:  []   High risk for COVID-19 with red flags go to ED (with CP, SOB, weak/lightheaded, or fever > 101.5). Call ahead.  []   High risk for COVID-19 but  stable. Inform provider and coordinate time for [facility] visit.   []   No red flags but URI signs or symptoms okay for [facility] visit.

## 2019-06-23 ENCOUNTER — Encounter: Payer: Self-pay | Admitting: Family Medicine

## 2019-06-23 ENCOUNTER — Other Ambulatory Visit: Payer: Self-pay

## 2019-06-23 ENCOUNTER — Ambulatory Visit (INDEPENDENT_AMBULATORY_CARE_PROVIDER_SITE_OTHER): Payer: 59 | Admitting: Family Medicine

## 2019-06-23 VITALS — BP 118/78 | HR 69 | Temp 98.5°F | Ht 61.0 in | Wt 201.6 lb

## 2019-06-23 DIAGNOSIS — Z0000 Encounter for general adult medical examination without abnormal findings: Secondary | ICD-10-CM

## 2019-06-23 DIAGNOSIS — E785 Hyperlipidemia, unspecified: Secondary | ICD-10-CM

## 2019-06-23 DIAGNOSIS — Z9889 Other specified postprocedural states: Secondary | ICD-10-CM | POA: Diagnosis not present

## 2019-06-23 DIAGNOSIS — N809 Endometriosis, unspecified: Secondary | ICD-10-CM

## 2019-06-23 DIAGNOSIS — Z793 Long term (current) use of hormonal contraceptives: Secondary | ICD-10-CM | POA: Insufficient documentation

## 2019-06-23 DIAGNOSIS — G4700 Insomnia, unspecified: Secondary | ICD-10-CM | POA: Insufficient documentation

## 2019-06-23 DIAGNOSIS — E559 Vitamin D deficiency, unspecified: Secondary | ICD-10-CM | POA: Diagnosis not present

## 2019-06-23 DIAGNOSIS — F4323 Adjustment disorder with mixed anxiety and depressed mood: Secondary | ICD-10-CM | POA: Diagnosis not present

## 2019-06-23 DIAGNOSIS — Z23 Encounter for immunization: Secondary | ICD-10-CM | POA: Diagnosis not present

## 2019-06-23 DIAGNOSIS — E038 Other specified hypothyroidism: Secondary | ICD-10-CM

## 2019-06-23 LAB — COMPREHENSIVE METABOLIC PANEL
ALT: 14 U/L (ref 0–35)
AST: 19 U/L (ref 0–37)
Albumin: 4.6 g/dL (ref 3.5–5.2)
Alkaline Phosphatase: 62 U/L (ref 39–117)
BUN: 13 mg/dL (ref 6–23)
CO2: 25 mEq/L (ref 19–32)
Calcium: 9.3 mg/dL (ref 8.4–10.5)
Chloride: 104 mEq/L (ref 96–112)
Creatinine, Ser: 0.72 mg/dL (ref 0.40–1.20)
GFR: 93.12 mL/min (ref >60.00)
Glucose, Bld: 92 mg/dL (ref 70–99)
Potassium: 3.9 mEq/L (ref 3.5–5.1)
Sodium: 137 mEq/L (ref 135–145)
Total Bilirubin: 0.6 mg/dL (ref 0.2–1.2)
Total Protein: 7.4 g/dL (ref 6.0–8.3)

## 2019-06-23 LAB — LIPID PANEL
Cholesterol: 162 mg/dL (ref 0–200)
HDL: 40.4 mg/dL (ref >39.00)
LDL Cholesterol: 97 mg/dL (ref 0–99)
NonHDL: 121.57
Total CHOL/HDL Ratio: 4
Triglycerides: 122 mg/dL (ref 0.0–149.0)
VLDL: 24.4 mg/dL (ref 0.0–40.0)

## 2019-06-23 LAB — CBC WITH DIFFERENTIAL/PLATELET
Basophils Absolute: 0 10*3/uL (ref 0.0–0.1)
Basophils Relative: 0.5 % (ref 0.0–3.0)
Eosinophils Absolute: 0.1 10*3/uL (ref 0.0–0.7)
Eosinophils Relative: 0.9 % (ref 0.0–5.0)
HCT: 40.7 % (ref 36.0–46.0)
Hemoglobin: 14.2 g/dL (ref 12.0–15.0)
Lymphocytes Relative: 25.5 % (ref 12.0–46.0)
Lymphs Abs: 1.7 10*3/uL (ref 0.7–4.0)
MCHC: 34.8 g/dL (ref 30.0–36.0)
MCV: 92.7 fl (ref 78.0–100.0)
Monocytes Absolute: 0.4 10*3/uL (ref 0.1–1.0)
Monocytes Relative: 6.8 % (ref 3.0–12.0)
Neutro Abs: 4.3 10*3/uL (ref 1.4–7.7)
Neutrophils Relative %: 66.3 % (ref 43.0–77.0)
Platelets: 218 10*3/uL (ref 150.0–400.0)
RBC: 4.39 Mil/uL (ref 3.87–5.11)
RDW: 12.9 % (ref 11.5–15.5)
WBC: 6.5 10*3/uL (ref 4.0–10.5)

## 2019-06-23 MED ORDER — SERTRALINE HCL 50 MG PO TABS: 50.0000 mg | ORAL_TABLET | Freq: Every day | ORAL | 3 refills | Status: DC

## 2019-06-23 NOTE — Assessment & Plan Note (Signed)
Labs today. Orders Placed This Encounter  Procedures  . Flu Vaccine QUAD 6+ mos PF IM (Fluarix Quad PF)  . CBC with Differential/Platelet  . Comprehensive metabolic panel  . Lipid panel  . TSH  . Vitamin D (25 hydroxy)  . T4

## 2019-06-23 NOTE — Assessment & Plan Note (Signed)
Reviewed preventive care protocols, scheduled due services, and updated immunizations Discussed nutrition, exercise, diet, and healthy lifestyle.  

## 2019-06-23 NOTE — Assessment & Plan Note (Signed)
Feels IUD is working well.

## 2019-06-23 NOTE — Assessment & Plan Note (Signed)
Check vit D. Today.

## 2019-06-23 NOTE — Progress Notes (Signed)
Subjective:   Patient ID: Kimberly Garner, female    DOB: February 08, 1986, 33 y.o.   MRN: [id number]  Kimberly Garner is a pleasant 33 y.o. year old female who presents to clinic today with Annual Exam (CPE/ fasting/ wants flu shot )  on 06/23/2019  HPI:  Health Maintenance  Topic Date Due  . PAP SMEAR-Modifier  10/05/2019  . TETANUS/TDAP  04/25/2020  . INFLUENZA VACCINE  Completed  . HIV Screening  Completed   Last pap smear was done in 08/2017 after she had a laparoscopic BTL and fulguration of endometriosis. w up with GYN in October.  Endometriosis determined laparoscopically by Dr. Samuella Cota.  Note reviewed from 08/15/18- He advised to continue Orlissa 150 mg daily and follow up with Dr. Amalia Hailey in 3 months.  Saw Dr. Amalia Hailey on 11/19/18- note reviewed.  She choice IUD as treatment option, which was inserted on 12/12/18. She does feel it helps.  Has stopped getting periods.  She saw her again on 01/08/09- he advised follow up in December and that Pap would be repeated at that time.  Adjustment disorder- anxiety more than depression. Walking has helped.  Has never been on any anxiety or depression.  She does feel these symptoms are situational since covid.  Depression screen [facility] 2/9 06/23/2019 06/20/2018 06/20/2018 09/29/2015  Decreased Interest 2 0 0 0  Down, Depressed, Hopeless 0 0 0 0  PHQ - 2 Score 2 0 0 0  Altered sleeping 1 2 - -  Tired, decreased energy 1 2 - -  Change in appetite 2 0 - -  Feeling bad or failure about yourself  2 0 - -  Trouble concentrating 1 0 - -  Moving slowly or fidgety/restless 0 0 - -  Suicidal thoughts 0 0 - -  PHQ-9 Score 9 4 - -  Difficult doing work/chores Somewhat difficult Somewhat difficult - -   GAD 7 : Generalized Anxiety Score 06/23/2019 06/20/2018  Nervous, Anxious, on Edge 2 0  Control/stop worrying 3 0  Worry too much - different things 3 0  Trouble relaxing 2 0  Restless 2 0  Easily annoyed or irritable 2 0  Afraid - awful might happen 2 0   Total GAD 7 Score 16 0  Anxiety Difficulty Somewhat difficult Not difficult at all     Insomnia- difficulty falling asleep and staying asleep.  Now several times per week.  Hypothyroidism- clinically euthyroid on synthroid 75 mcg daily. Denies any symptoms of hypo or hyperthyroidism.  Lab Results  Component Value Date   TSH 2.02 06/20/2018   Lab Results  Component Value Date   CHOL 159 06/20/2018   HDL 50.90 06/20/2018   LDLCALC 82 06/20/2018   LDLDIRECT 68.4 07/28/2010   TRIG 130.0 06/20/2018   CHOLHDL 3 06/20/2018   Current Outpatient Medications on File Prior to Visit  Medication Sig Dispense Refill  . albuterol (VENTOLIN HFA) 108 (90 Base) MCG/ACT inhaler Inhale 2 puffs into the lungs every 6 (six) hours as needed for wheezing or shortness of breath. 8 g 1  . Cholecalciferol (VITAMIN D) 50 MCG (2000 UT) tablet Take 2,000 Units by mouth daily.    Marland Kitchen ibuprofen (ADVIL,MOTRIN) 800 MG tablet Take 1 tablet (800 mg total) by mouth 3 (three) times daily. 30 tablet 1  . levonorgestrel (MIRENA) 20 MCG/24HR IUD 1 each by Intrauterine route once.    Marland Kitchen levothyroxine (SYNTHROID) 75 MCG tablet TAKE 1 TABLET(75 MCG) BY MOUTH DAILY BEFORE BREAKFAST 90 tablet 0  .  Prenatal Multivit-Min-Fe-FA (PRENATAL VITAMINS PO) Take 1 tablet by mouth daily.     No current facility-administered medications on file prior to visit.     No Known Allergies  Past Medical History:  Diagnosis Date  . Anemia    iron deficiency  . Breast lump 2014  . Family history of adverse reaction to anesthesia    MOM-HARD TO WAKE UP  . Headache    MIGRAINES  . Hypothyroidism   . Thyroid disease   . Vitamin D deficiency     Past Surgical History:  Procedure Laterality Date  . CESAREAN SECTION  2014  . CHOLECYSTECTOMY  2012  . FETAL BLOOD TRANSFUSION  2010  . FOOT SURGERY  2013  . HYSTEROSCOPY W/D&C N/A 09/24/2017   Procedure: DILATATION AND CURETTAGE /HYSTEROSCOPY;  Surgeon: Defrancesco, Prentice DockerMartin A, MD;   Location: [facility];  Service: Gynecology;  Laterality: N/A;  . LAPAROSCOPIC TUBAL LIGATION Bilateral 09/24/2017   Procedure: LAPAROSCOPIC BILATERAL TUBAL LIGATION;  Surgeon: Herold Harmsefrancesco, Martin A, MD;  Location: [facility];  Service: Gynecology;  Laterality: Bilateral;  . LAPAROSCOPY N/A 09/24/2017   Procedure: LAPAROSCOPY DIAGNOSTIC;  Surgeon: Herold Harmsefrancesco, Martin A, MD;  Location: [facility];  Service: Gynecology;  Laterality: N/A;    Family History  Problem Relation Age of Onset  . Hypertension Mother   . Diabetes Mother   . Breast cancer Neg Hx   . Ovarian cancer Neg Hx   . Colon cancer Neg Hx     Social History   Socioeconomic History  . Marital status: Married    Spouse name: Not on file  . Number of children: Not on file  . Years of education: Not on file  . Highest education level: Not on file  Occupational History  . Not on file  Social Needs  . Financial resource strain: Not on file  . Food insecurity    Worry: Not on file    Inability: Not on file  . Transportation needs    Medical: Not on file    Non-medical: Not on file  Tobacco Use  . Smoking status: Never Smoker  . Smokeless tobacco: Never Used  Substance and Sexual Activity  . Alcohol use: Yes    Comment: OCC  . Drug use: No  . Sexual activity: Yes    Birth control/protection: I.U.D.  Lifestyle  . Physical activity    Days per week: Not on file    Minutes per session: Not on file  . Stress: Not on file  Relationships  . Social Musicianconnections    Talks on phone: Not on file    Gets together: Not on file    Attends religious service: Not on file    Active member of club or organization: Not on file    Attends meetings of clubs or organizations: Not on file    Relationship status: Not on file  . Intimate partner violence    Fear of current or ex partner: Not on file    Emotionally abused: Not on file    Physically abused: Not on file    Forced sexual activity: Not on file  Other Topics Concern  . Not  on file  Social History Narrative   Works in Location managerfront office at [employer]   Married   The PMH, PSH, Social History, Family History, Medications, and allergies [facility], and have been updated if relevant.   Review of Systems  Constitutional: Negative.   HENT: Negative.   Respiratory: Negative.  Cardiovascular: Negative.   Gastrointestinal: Negative.   Musculoskeletal: Negative.   Skin: Negative.   Psychiatric/Behavioral: Positive for agitation, decreased concentration, dysphoric mood and sleep disturbance. Negative for behavioral problems, confusion, hallucinations, self-injury and suicidal ideas. The patient is nervous/anxious. The patient is not hyperactive.   All other systems reviewed and are negative.      Objective:    BP 118/78   Pulse 69   Temp 98.5 F (36.9 C) (Oral)   Ht 5\' 1"  (1.549 m)   Wt 201 lb 9.6 oz (91.4 kg)   SpO2 98%   BMI 38.09 kg/m    Wt Readings from Last 3 Encounters:  06/23/19 201 lb 9.6 oz (91.4 kg)  03/20/19 198 lb (89.8 kg)  01/09/19 199 lb 4.8 oz (90.4 kg)    General:  Well-developed,well-nourished,in no acute distress; alert,appropriate and cooperative throughout examination Head:  normocephalic and atraumatic.   Eyes:  vision grossly intact, PERRL Ears:  R ear normal and L ear normal externally, TMs clear bilaterally Nose:  no external deformity.   Mouth:  good dentition.   Neck:  No deformities, masses, or tenderness noted. Breasts:  No mass, nodules, thickening, tenderness, bulging, retraction, inflamation, nipple discharge or skin changes noted.   Lungs:  Normal respiratory effort, chest expands symmetrically. Lungs are clear to auscultation, no crackles or wheezes. Heart:  Normal rate and regular rhythm. S1 and S2 normal without gallop, murmur, click, rub or other extra sounds. Abdomen:  Bowel sounds positive,abdomen soft and non-tender without masses, organomegaly or hernias noted. Msk:  No deformity or  scoliosis noted of thoracic or lumbar spine.   Extremities:  No clubbing, cyanosis, edema, or deformity noted with normal full range of motion of all joints.   Neurologic:  alert & oriented X3 and gait normal.   Skin:  Intact without suspicious lesions or rashes Cervical Nodes:  No lymphadenopathy noted Axillary Nodes:  No palpable lymphadenopathy Psych:  Cognition and judgment appear intact. Alert and cooperative with normal attention span and concentration. No apparent delusions, illusions, hallucinations        Assessment & Plan:   Well woman exam without gynecological exam  Need for influenza vaccination - Plan: Flu Vaccine QUAD 6+ mos PF IM (Fluarix Quad PF)  Vitamin D deficiency - Plan: Vitamin D (25 hydroxy)  Endometriosis determined by laparoscopy  Hyperlipidemia, unspecified hyperlipidemia type - Plan: CBC with Differential/Platelet, Comprehensive metabolic panel, Lipid panel  Status post laparoscopic bilateral salpingectomy/excision and fulguration of endometriosis  Long term (current) use of hormonal contraceptives  Other specified hypothyroidism - Plan: TSH, T4  Insomnia, unspecified type No follow-ups on file.

## 2019-06-23 NOTE — Patient Instructions (Addendum)
Great to see you!  I will call you with your lab results from today and you can view them online.   We are starting Zoloft 50 mg. Take 1/2 tablet daily (25 mg daily)for 10 days, then advance to 1 full tablet thereafter if desired.. As discussed possible side effects of headache, GI upset, drowsiness, and SI/HI. If thoughts of SI/HI develop, please present to the emergency immediately.   Please update me in a few weeks.    DASH Eating Plan DASH stands for "Dietary Approaches to Stop Hypertension." The DASH eating plan is a healthy eating plan that has been shown to reduce high blood pressure (hypertension). It may also reduce your risk for type 2 diabetes, heart disease, and stroke. The DASH eating plan may also help with weight loss. What are tips for following this plan?  General guidelines  Avoid eating more than 2,300 mg (milligrams) of salt (sodium) a day. If you have hypertension, you may need to reduce your sodium intake to 1,500 mg a day.  Limit alcohol intake to no more than 1 drink a day for nonpregnant women and 2 drinks a day for men. One drink equals 12 oz of beer, 5 oz of wine, or 1 oz of hard liquor.  Work with your health care provider to maintain a healthy body weight or to lose weight. Ask what an ideal weight is for you.  Get at least 30 minutes of exercise that causes your heart to beat faster (aerobic exercise) most days of the week. Activities may include walking, swimming, or biking.  Work with your health care provider or diet and nutrition specialist (dietitian) to adjust your eating plan to your individual calorie needs. Reading food labels   Check food labels for the amount of sodium per serving. Choose foods with less than 5 percent of the Daily Value of sodium. Generally, foods with less than 300 mg of sodium per serving fit into this eating plan.  To find whole grains, look for the word "whole" as the first word in the ingredient list. Shopping  Buy  products labeled as "low-sodium" or "no salt added."  Buy fresh foods. Avoid canned foods and premade or frozen meals. Cooking  Avoid adding salt when cooking. Use salt-free seasonings or herbs instead of table salt or sea salt. Check with your health care provider or pharmacist before using salt substitutes.  Do not fry foods. Cook foods using healthy methods such as baking, boiling, grilling, and broiling instead.  Cook with heart-healthy oils, such as olive, canola, soybean, or sunflower oil. Meal planning  Eat a balanced diet that includes: ? 5 or more servings of fruits and vegetables each day. At each meal, try to fill half of your plate with fruits and vegetables. ? Up to 6-8 servings of whole grains each day. ? Less than 6 oz of lean meat, poultry, or fish each day. A 3-oz serving of meat is about the same size as a deck of cards. One egg equals 1 oz. ? 2 servings of low-fat dairy each day. ? A serving of nuts, seeds, or beans 5 times each week. ? Heart-healthy fats. Healthy fats called Omega-3 fatty acids are found in foods such as flaxseeds and coldwater fish, like sardines, salmon, and mackerel.  Limit how much you eat of the following: ? Canned or prepackaged foods. ? Food that is high in trans fat, such as fried foods. ? Food that is high in saturated fat, such as fatty meat. ?  Sweets, desserts, sugary drinks, and other foods with added sugar. ? Full-fat dairy products.  Do not salt foods before eating.  Try to eat at least 2 vegetarian meals each week.  Eat more home-cooked food and less restaurant, buffet, and fast food.  When eating at a restaurant, ask that your food be prepared with less salt or no salt, if possible. What foods are recommended? The items listed may not be a complete list. Talk with your dietitian about what dietary choices are best for you. Grains Whole-grain or whole-wheat bread. Whole-grain or whole-wheat pasta. Brown rice. [place].  Bulgur. Whole-grain and low-sodium cereals. Pita bread. Low-fat, low-sodium crackers. Whole-wheat flour tortillas. Vegetables Fresh or frozen vegetables (raw, steamed, roasted, or grilled). Low-sodium or reduced-sodium tomato and vegetable juice. Low-sodium or reduced-sodium tomato sauce and tomato paste. Low-sodium or reduced-sodium canned vegetables. Fruits All fresh, dried, or frozen fruit. Canned fruit in natural juice (without added sugar). Meat and other protein foods Skinless chicken or [place]. Ground chicken or [place]. Pork with fat trimmed off. Fish and seafood. Egg whites. Dried beans, peas, or lentils. Unsalted nuts, nut butters, and seeds. Unsalted canned beans. Lean cuts of beef with fat trimmed off. Low-sodium, lean deli meat. Dairy Low-fat (1%) or fat-free (skim) milk. Fat-free, low-fat, or reduced-fat cheeses. Nonfat, low-sodium ricotta or cottage cheese. Low-fat or nonfat yogurt. Low-fat, low-sodium cheese. Fats and oils Soft margarine without trans fats. Vegetable oil. Low-fat, reduced-fat, or light mayonnaise and salad dressings (reduced-sodium). Canola, safflower, olive, soybean, and sunflower oils. Avocado. Seasoning and other foods Herbs. Spices. Seasoning mixes without salt. Unsalted popcorn and pretzels. Fat-free sweets. What foods are not recommended? The items listed may not be a complete list. Talk with your dietitian about what dietary choices are best for you. Grains Baked goods made with fat, such as croissants, muffins, or some breads. Dry pasta or rice meal packs. Vegetables Creamed or fried vegetables. Vegetables in a cheese sauce. Regular canned vegetables (not low-sodium or reduced-sodium). Regular canned tomato sauce and paste (not low-sodium or reduced-sodium). Regular tomato and vegetable juice (not low-sodium or reduced-sodium). Angie Fava. Olives. Fruits Canned fruit in a light or heavy syrup. Fried fruit. Fruit in cream or butter sauce. Meat and other  protein foods Fatty cuts of meat. Ribs. Fried meat. Berniece Salines. Sausage. Bologna and other processed lunch meats. Salami. Fatback. Hotdogs. Bratwurst. Salted nuts and seeds. Canned beans with added salt. Canned or smoked fish. Whole eggs or egg yolks. Chicken or [place] with skin. Dairy Whole or 2% milk, cream, and half-and-half. Whole or full-fat cream cheese. Whole-fat or sweetened yogurt. Full-fat cheese. Nondairy creamers. Whipped toppings. Processed cheese and cheese spreads. Fats and oils Butter. Stick margarine. Lard. Shortening. Ghee. Bacon fat. Tropical oils, such as coconut, palm kernel, or palm oil. Seasoning and other foods Salted popcorn and pretzels. Onion salt, garlic salt, seasoned salt, table salt, and sea salt. Worcestershire sauce. Tartar sauce. Barbecue sauce. Teriyaki sauce. Soy sauce, including reduced-sodium. Steak sauce. Canned and packaged gravies. Fish sauce. Oyster sauce. Cocktail sauce. Horseradish that you find on the shelf. Ketchup. Mustard. Meat flavorings and tenderizers. Bouillon cubes. Hot sauce and Tabasco sauce. Premade or packaged marinades. Premade or packaged taco seasonings. Relishes. Regular salad dressings. Where to find more information:  National Heart, Lung, and [place]: https://wilson-eaton.com/  American Heart Association: www.heart.org Summary  The DASH eating plan is a healthy eating plan that has been shown to reduce high blood pressure (hypertension). It may also reduce your risk for type 2 diabetes,  heart disease, and stroke.  With the DASH eating plan, you should limit salt (sodium) intake to 2,300 mg a day. If you have hypertension, you may need to reduce your sodium intake to 1,500 mg a day.  When on the DASH eating plan, aim to eat more fresh fruits and vegetables, whole grains, lean proteins, low-fat dairy, and heart-healthy fats.  Work with your health care provider or diet and nutrition specialist (dietitian) to adjust your eating plan to your  individual calorie needs. This information is not intended to replace advice given to you by your health care provider. Make sure you discuss any questions you have with your health care provider. Document Released: 10/05/2011 Document Revised: 09/28/2017 Document Reviewed: 10/09/2016 Elsevier Patient Education  2020 [employer].

## 2019-06-23 NOTE — Assessment & Plan Note (Addendum)
Total time spent with patient was 45 minutes, with 25 minutes spent specifically on problem visit concerning anxiety/ depression.  Situational but nothing is changing anytime soon with work stress and balancing life and she agrees she needs help.  She is declining psychotherapy at this time.  We discussed treated options- we agreed start Zoloft 50 mg. Patient is to take 1/2 tablet daily for 10 days, then advance to 1 full tablet thereafter if desired.. We discussed possible side effects of headache, GI upset, drowsiness, and SI/HI. If thoughts of SI/HI develop, we discussed to present to the emergency immediately. Patient verbalized understanding.   Advised to please update me in a few weeks.

## 2019-06-23 NOTE — Assessment & Plan Note (Signed)
Clinically euthyroid but due for labs. Orders Placed This Encounter  Procedures  . Flu Vaccine QUAD 6+ mos PF IM (Fluarix Quad PF)  . CBC with Differential/Platelet  . Comprehensive metabolic panel  . Lipid panel  . TSH  . Vitamin D (25 hydroxy)  . T4

## 2019-06-24 LAB — T4: T4, Total: 7.7 ug/dL (ref 5.1–11.9)

## 2019-06-24 LAB — VITAMIN D 25 HYDROXY (VIT D DEFICIENCY, FRACTURES): VITD: 48.36 ng/mL (ref 30.00–100.00)

## 2019-06-24 LAB — TSH: TSH: 1.56 u[IU]/mL (ref 0.35–4.50)

## 2019-07-10 ENCOUNTER — Encounter: Payer: Self-pay | Admitting: Family Medicine

## 2019-07-11 ENCOUNTER — Other Ambulatory Visit: Payer: Self-pay | Admitting: Family Medicine

## 2019-07-11 MED ORDER — SERTRALINE HCL 50 MG PO TABS: 50.0000 mg | ORAL_TABLET | Freq: Two times a day (BID) | ORAL | 3 refills | Status: DC

## 2019-08-21 ENCOUNTER — Encounter: Payer: Self-pay | Admitting: Family Medicine

## 2019-09-03 ENCOUNTER — Other Ambulatory Visit: Payer: Self-pay

## 2019-09-03 MED ORDER — LEVOTHYROXINE SODIUM 75 MCG PO TABS: ORAL_TABLET | ORAL | 0 refills | Status: DC

## 2019-09-03 NOTE — Telephone Encounter (Signed)
Last fill 05/30/19  #90/0 Last OV 06/23/19

## 2019-10-14 ENCOUNTER — Encounter: Payer: 59 | Admitting: Obstetrics and Gynecology

## 2019-10-23 ENCOUNTER — Other Ambulatory Visit (INDEPENDENT_AMBULATORY_CARE_PROVIDER_SITE_OTHER): Payer: 59

## 2019-10-23 ENCOUNTER — Other Ambulatory Visit: Payer: Self-pay

## 2019-10-23 ENCOUNTER — Encounter: Payer: Self-pay | Admitting: Obstetrics and Gynecology

## 2019-10-23 ENCOUNTER — Ambulatory Visit (INDEPENDENT_AMBULATORY_CARE_PROVIDER_SITE_OTHER): Payer: 59 | Admitting: Obstetrics and Gynecology

## 2019-10-23 ENCOUNTER — Other Ambulatory Visit (HOSPITAL_COMMUNITY)
Admission: RE | Admit: 2019-10-23 | Discharge: 2019-10-23 | Disposition: A | Discharge status: Home / Self Care | Payer: 59 | Source: Ambulatory Visit | Attending: Obstetrics and Gynecology | Admitting: Obstetrics and Gynecology

## 2019-10-23 VITALS — BP 108/68 | HR 71 | Ht 61.0 in | Wt 206.9 lb

## 2019-10-23 DIAGNOSIS — Z124 Encounter for screening for malignant neoplasm of cervix: Secondary | ICD-10-CM

## 2019-10-23 DIAGNOSIS — Z01419 Encounter for gynecological examination (general) (routine) without abnormal findings: Secondary | ICD-10-CM

## 2019-10-23 DIAGNOSIS — T8332XA Displacement of intrauterine contraceptive device, initial encounter: Secondary | ICD-10-CM | POA: Diagnosis not present

## 2019-10-23 NOTE — Addendum Note (Signed)
Addended by: Durwin Glaze on: 10/23/2019 10:09 AM   Modules accepted: Orders

## 2019-10-23 NOTE — Progress Notes (Signed)
HPI:      [name] is a 33 y.o. G1P1001 who LMP was No LMP recorded. (Menstrual status: IUD).  Subjective:   She presents today for her annual examination.  She has an IUD in place.  She continues to experience monthly menstrual periods but they are light and she is very happy because she no longer has pain with them. She previously had both fallopian tubes removed.    Hx: The following portions of the patient's history were reviewed and updated as appropriate:             She  has a past medical history of Anemia, Breast lump (2014), Family history of adverse reaction to anesthesia, Headache, Hypothyroidism, Thyroid disease, and Vitamin D deficiency. She does not have any pertinent problems on file. She  has a past surgical history that includes Cesarean section (2014); Fetal blood transfusion (2010); Cholecystectomy (2012); Foot surgery (2013); Hysteroscopy with D & C (N/A, 09/24/2017); laparoscopy (N/A, 09/24/2017); and Laparoscopic tubal ligation (Bilateral, 09/24/2017). Her family history includes Diabetes in her mother; Hypertension in her mother. She  reports that she has never smoked. She has never used smokeless tobacco. She reports current alcohol use. She reports that she does not use drugs. She has a current medication list which includes the following prescription(s): albuterol, vitamin d, ibuprofen, levonorgestrel, levothyroxine, and sertraline. She has No Known Allergies.       Review of Systems:  Review of Systems  Constitutional: Denied constitutional symptoms, night sweats, recent illness, fatigue, fever, insomnia and weight loss.  Eyes: Denied eye symptoms, eye pain, photophobia, vision change and visual disturbance.  Ears/Nose/Throat/Neck: Denied ear, nose, throat or neck symptoms, hearing loss, nasal discharge, sinus congestion and sore throat.  Cardiovascular: Denied cardiovascular symptoms, arrhythmia, chest pain/pressure, edema, exercise intolerance,  orthopnea and palpitations.  Respiratory: Denied pulmonary symptoms, asthma, pleuritic pain, productive sputum, cough, dyspnea and wheezing.  Gastrointestinal: Denied, gastro-esophageal reflux, melena, nausea and vomiting.  Genitourinary: Denied genitourinary symptoms including symptomatic vaginal discharge, pelvic relaxation issues, and urinary complaints.  Musculoskeletal: Denied musculoskeletal symptoms, stiffness, swelling, muscle weakness and myalgia.  Dermatologic: Denied dermatology symptoms, rash and scar.  Neurologic: Denied neurology symptoms, dizziness, headache, neck pain and syncope.  Psychiatric: Denied psychiatric symptoms, anxiety and depression.  Endocrine: Denied endocrine symptoms including hot flashes and night sweats.   Meds:   Current Outpatient Medications on File Prior to Visit  Medication Sig Dispense Refill  . albuterol (VENTOLIN HFA) 108 (90 Base) MCG/ACT inhaler Inhale 2 puffs into the lungs every 6 (six) hours as needed for wheezing or shortness of breath. 8 g 1  . Cholecalciferol (VITAMIN D) 50 MCG (2000 UT) tablet Take 2,000 Units by mouth daily.    Marland Kitchen ibuprofen (ADVIL,MOTRIN) 800 MG tablet Take 1 tablet (800 mg total) by mouth 3 (three) times daily. 30 tablet 1  . levonorgestrel (MIRENA) 20 MCG/24HR IUD 1 each by Intrauterine route once.    Marland Kitchen levothyroxine (SYNTHROID) 75 MCG tablet TAKE 1 TABLET(75 MCG) BY MOUTH DAILY BEFORE BREAKFAST 90 tablet 0  . sertraline (ZOLOFT) 50 MG tablet Take 1 tablet (50 mg total) by mouth 2 (two) times daily. 60 tablet 3   No current facility-administered medications on file prior to visit.    Objective:     Vitals:   10/23/19 0901  BP: 108/68  Pulse: 71              Physical examination General NAD, Conversant  HEENT Atraumatic; Op clear with mmm.  Normo-cephalic. Pupils reactive. Anicteric sclerae  Thyroid/Neck Smooth without nodularity or enlargement. Normal ROM.  Neck Supple.  Skin No rashes, lesions or ulceration.  Normal palpated skin turgor. No nodularity.  Breasts: No masses or discharge.  Symmetric.  No axillary adenopathy.  Lungs: Clear to auscultation.No rales or wheezes. Normal Respiratory effort, no retractions.  Heart: NSR.  No murmurs or rubs appreciated. No periferal edema  Abdomen: Soft.  Non-tender.  No masses.  No HSM. No hernia  Extremities: Moves all appropriately.  Normal ROM for age. No lymphadenopathy.  Neuro: Oriented to PPT.  Normal mood. Normal affect.     Pelvic:   Vulva: Normal appearance.  No lesions.  Vagina: No lesions or abnormalities noted.  Support: Normal pelvic support.  Urethra No masses tenderness or scarring.  Meatus Normal size without lesions or prolapse.  Cervix: Normal appearance.  No lesions.  IUD strings not found even when using a tonsil.  Anus: Normal exam.  No lesions.  Perineum: Normal exam.  No lesions.        Bimanual   Uterus: Normal size.  Non-tender.  Mobile.  AV.  Adnexae: No masses.  Non-tender to palpation.  Cul-de-sac: Negative for abnormality.      Assessment:    G1P1001 Patient Active Problem List   Diagnosis Date Noted  . Long term (current) use of hormonal contraceptives 06/23/2019  . Well woman exam without gynecological exam 06/23/2019  . Insomnia 06/23/2019  . Adjustment disorder with mixed anxiety and depressed mood 06/23/2019  . Vitamin D deficiency 06/20/2018  . Endometriosis determined by laparoscopy 09/24/2017  . Status post laparoscopic bilateral salpingectomy/excision and fulguration of endometriosis 09/24/2017  . PCO (polycystic ovaries) 08/14/2017  . Status post hysteroscopy/D&C 08/14/2017  . Obesity (BMI 35.0-39.9 without comorbidity) 08/14/2017  . History of blood transfusion 08/14/2017  . Menorrhagia with irregular cycle 08/14/2017  . CHOLELITHIASIS 10/10/2010  . HYPERTRIGLYCERIDEMIA 09/08/2010  . HLD (hyperlipidemia) 07/28/2010  . Other specified hypothyroidism 04/25/2010  . ANEMIA-IRON DEFICIENCY 04/25/2010   . FATIGUE 04/25/2010  . ELEVATED BP READING WITHOUT DX HYPERTENSION 04/25/2010  . IRON DEFICIENCY ANEMIA, HX OF 04/25/2010     1. Well woman exam with routine gynecological exam   2. Intrauterine contraceptive device threads lost, initial encounter     Likely that IUD intrauterine, but strings not found.  The combination of monthly menses and strings not being found leads me to investigate location of IUD by ultrasound.  (Not a birth control issue as patient's fallopian tubes are gone)   Plan:            1.  Basic Screening Recommendations The basic screening recommendations for asymptomatic women were discussed with the patient during her visit.  The age-appropriate recommendations were discussed with her and the rational for the tests reviewed.  When I am informed by the patient that another primary care physician has previously obtained the age-appropriate tests and they are up-to-date, only outstanding tests are ordered and referrals given as necessary.  Abnormal results of tests will be discussed with her when all of her results are completed.  Routine preventative health maintenance measures emphasized: Exercise/Diet/Weight control, Tobacco Warnings, Alcohol/Substance use risks and Stress Management Pap performed 2.  Ultrasound for IUD location Orders No orders of the defined types were placed in this encounter.   No orders of the defined types were placed in this encounter.       F/U  Return for We will contact her with any abnormal test results.  Onalee Huaavid  Danne [place], M.D. 10/23/2019 10:00 AM

## 2019-10-28 LAB — CYTOLOGY - PAP
Comment: NEGATIVE
Diagnosis: NEGATIVE
High risk HPV: NEGATIVE

## 2019-11-04 ENCOUNTER — Encounter: Payer: Self-pay | Admitting: Family Medicine

## 2019-11-05 NOTE — Telephone Encounter (Signed)
FYI

## 2019-12-02 ENCOUNTER — Other Ambulatory Visit: Payer: Self-pay

## 2019-12-02 MED ORDER — LEVOTHYROXINE SODIUM 75 MCG PO TABS: ORAL_TABLET | ORAL | 1 refills | Status: DC

## 2020-01-15 ENCOUNTER — Other Ambulatory Visit: Payer: Self-pay

## 2020-01-15 ENCOUNTER — Ambulatory Visit (INDEPENDENT_AMBULATORY_CARE_PROVIDER_SITE_OTHER): Payer: 59 | Admitting: Family Medicine

## 2020-01-15 ENCOUNTER — Encounter: Payer: Self-pay | Admitting: Family Medicine

## 2020-01-15 VITALS — BP 122/72 | HR 81 | Temp 98.1°F | Ht 61.0 in | Wt 212.2 lb

## 2020-01-15 DIAGNOSIS — E669 Obesity, unspecified: Secondary | ICD-10-CM | POA: Diagnosis not present

## 2020-01-15 DIAGNOSIS — E038 Other specified hypothyroidism: Secondary | ICD-10-CM | POA: Diagnosis not present

## 2020-01-15 DIAGNOSIS — F4323 Adjustment disorder with mixed anxiety and depressed mood: Secondary | ICD-10-CM

## 2020-01-15 DIAGNOSIS — N921 Excessive and frequent menstruation with irregular cycle: Secondary | ICD-10-CM

## 2020-01-15 MED ORDER — SERTRALINE HCL 50 MG PO TABS: 50.0000 mg | ORAL_TABLET | Freq: Two times a day (BID) | ORAL | 2 refills | Status: DC

## 2020-01-15 NOTE — Progress Notes (Signed)
Subjective:     Kimberly Garner is a 34 y.o. female presenting for Transfer of Care (from Dr Deborra Medina)     HPI   #Depression - tried taking 50 mg once a day - for the most part symptoms controled - had her mom's death date come up and had a short course of depression and didn't feel herself - typically anxiety symptoms - is feeling better  #IUD/heavy periods - lost the strings - has had 2 light periods this month - still gets pain and cramp - pill did not help - tried [place] - got headaches - considering a hysterectomy  #weight gain - diet - has been working on cutting calories, tried keto diet - cooks at home - exercise regularly - tries to avoid fried foods (no gallbladder) - veggies/fruits - lowest weight 190 lbs - highest weight 240 lbs  Review of Systems   Social History   Tobacco Use  Smoking Status Never Smoker  Smokeless Tobacco Never Used        Objective:    BP Readings from Last 3 Encounters:  01/15/20 122/72  10/23/19 108/68  06/23/19 118/78   Wt Readings from Last 3 Encounters:  01/15/20 212 lb 4 oz (96.3 kg)  10/23/19 206 lb 14.4 oz (93.8 kg)  06/23/19 201 lb 9.6 oz (91.4 kg)    BP 122/72   Pulse 81   Temp 98.1 F (36.7 C)   Ht 5\' 1"  (1.549 m)   Wt 212 lb 4 oz (96.3 kg)   SpO2 98%   BMI 40.10 kg/m    Physical Exam Constitutional:      General: She is not in acute distress.    Appearance: She is well-developed. She is obese. She is not diaphoretic.  HENT:     Right Ear: External ear normal.     Left Ear: External ear normal.     Nose: Nose normal.  Eyes:     Conjunctiva/sclera: Conjunctivae normal.  Cardiovascular:     Rate and Rhythm: Normal rate.  Pulmonary:     Effort: Pulmonary effort is normal.  Musculoskeletal:     Cervical back: Neck supple.  Skin:    General: Skin is warm and dry.     Capillary Refill: Capillary refill takes less than 2 seconds.  Neurological:     Mental Status: She is alert. Mental status is  at baseline.  Psychiatric:        Mood and Affect: Mood normal.        Behavior: Behavior normal.           Assessment & Plan:   Problem List Items Addressed This Visit      Endocrine   Other specified hypothyroidism     Other   Obesity (BMI 35.0-39.9 without comorbidity)    Discussed weight loss strategies and body mechanics. Encouraged continued exercise and continued calorie counting with varied diet. F/u 6 months.       Menorrhagia with irregular cycle    Pt has had 2 light periods this month. Follows with Dr. Amalia Hailey. She is hoping to defer a hysterectomy but her IUD dose not seem to be helping. Failed D&C, OCPs. Encouraged not rushing on decision. Currently w/o anemia on recent blood work      Adjustment disorder with mixed anxiety and depressed mood - Primary    Discussed trying to take 100 mg zoloft, once daily. She will try. If symptoms stable, will change prescription      Relevant  Medications   sertraline (ZOLOFT) 50 MG tablet       Return in about 6 months (around 07/17/2020) for annual.  Lynnda Child, MD

## 2020-01-15 NOTE — Assessment & Plan Note (Signed)
Pt has had 2 light periods this month. Follows with Dr. Logan Bores. She is hoping to defer a hysterectomy but her IUD dose not seem to be helping. Failed D&C, OCPs. Encouraged not rushing on decision. Currently w/o anemia on recent blood work

## 2020-01-15 NOTE — Assessment & Plan Note (Signed)
Discussed weight loss strategies and body mechanics. Encouraged continued exercise and continued calorie counting with varied diet. F/u 6 months.

## 2020-01-15 NOTE — Assessment & Plan Note (Signed)
Discussed trying to take 100 mg zoloft, once daily. She will try. If symptoms stable, will change prescription

## 2020-01-15 NOTE — Patient Instructions (Addendum)
Try taking Zoloft 100 mg once daily Ok to continue if you feel your symptoms are controlled let me know and I can prescribe a 100 mg tablet  If not, ok to return to twice daily   Here is what I would recommend:  1) Increase the amount of water you drink a day > specifically drink a glass of water 8 oz or more before every meal 2) Could start a fiber supplement (like metamucil) > You could take this up to 3 times a day, but I would start with 1 time a day until you get used to it. It may cause some stomach upset 3) Make sure you sit down to eat and eat slowly (cut meat one piece at a time) > specifically train your body to eat only at the table (avoiding snacking in front of the TV) 4) Fill up on healthy items first > consider eating a salad with low calorie salad dressing before every meal  5) Make 1/2 of your plate vegetables 6) Keep healthy snacks available for those times when you are bored 7) Consider using a calorie counting app like MyFitnessPal > counting calories helps you make wise choices around snacking. Or if you can afford it you could sign up for Weight Watchers -- and learn about healthy options through a point system

## 2020-05-08 ENCOUNTER — Encounter: Payer: Self-pay | Admitting: Family Medicine

## 2020-05-08 DIAGNOSIS — F4323 Adjustment disorder with mixed anxiety and depressed mood: Secondary | ICD-10-CM

## 2020-05-11 ENCOUNTER — Telehealth (INDEPENDENT_AMBULATORY_CARE_PROVIDER_SITE_OTHER): Payer: 59 | Admitting: Family Medicine

## 2020-05-11 ENCOUNTER — Encounter: Payer: Self-pay | Admitting: Family Medicine

## 2020-05-11 VITALS — HR 92 | Ht 61.0 in | Wt 225.2 lb

## 2020-05-11 DIAGNOSIS — F4323 Adjustment disorder with mixed anxiety and depressed mood: Secondary | ICD-10-CM | POA: Diagnosis not present

## 2020-05-11 MED ORDER — BUPROPION HCL ER (XL) 150 MG PO TB24: ORAL_TABLET | ORAL | 1 refills | Status: DC

## 2020-05-11 NOTE — Assessment & Plan Note (Signed)
Already working on regular exercise. Has gained ~10 lbs with zoloft. Will switch medication to wellbutrin today. Encouraged calorie counting.

## 2020-05-11 NOTE — Progress Notes (Signed)
    I connected with [name] on 05/11/20 at  8:00 AM EDT by video and verified that I am speaking with the correct person using two identifiers.   I discussed the limitations, risks, security and privacy concerns of performing an evaluation and management service by video and the availability of in person appointments. I also discussed with the patient that there may be a patient responsible charge related to this service. The patient expressed understanding and agreed to proceed.  Patient location: Home Provider Location: [place] Participants: Lynnda Child and [name]   Subjective:     Kimberly Garner is a 34 y.o. female presenting for discuss anxiety and discuss weight gain     HPI  #Weight gain - has gained 10-13 lbs since starting the medication  #Anxiety - started zoloft - with the weight gain her husband has noticed a difference in her attitude - more quiet, keeping to herself - anxiety is a little better, but mood overall is worse   Review of Systems   Social History   Tobacco Use  Smoking Status Never Smoker  Smokeless Tobacco Never Used        Objective:   BP Readings from Last 3 Encounters:  01/15/20 122/72  10/23/19 108/68  06/23/19 118/78   Wt Readings from Last 3 Encounters:  05/11/20 225 lb 4 oz (102.2 kg)  01/15/20 212 lb 4 oz (96.3 kg)  10/23/19 206 lb 14.4 oz (93.8 kg)    Pulse 92   Ht 5\' 1"  (1.549 m)   Wt 225 lb 4 oz (102.2 kg)   BMI 42.56 kg/m    Physical Exam Constitutional:      Appearance: Normal appearance. She is not ill-appearing.  HENT:     Head: Normocephalic and atraumatic.     Right Ear: External ear normal.     Left Ear: External ear normal.  Eyes:     Conjunctiva/sclera: Conjunctivae normal.  Pulmonary:     Effort: Pulmonary effort is normal. No respiratory distress.  Neurological:     Mental Status: She is alert. Mental status is at baseline.  Psychiatric:        Mood and Affect:  Mood normal.        Behavior: Behavior normal.        Thought Content: Thought content normal.        Judgment: Judgment normal.         Assessment & Plan:   Problem List Items Addressed This Visit    None       No follow-ups on file.  , MD

## 2020-05-11 NOTE — Patient Instructions (Signed)
Great to see you!  Starting today Decrease Zoloft to 50 mg daily Start Wellbutrin 150 mg daily  After 1 week - if tolerating Stop Zoloft Increase Wellbutrin to 300 mg daily

## 2020-06-03 ENCOUNTER — Other Ambulatory Visit: Payer: Self-pay

## 2020-06-03 MED ORDER — LEVOTHYROXINE SODIUM 75 MCG PO TABS: ORAL_TABLET | ORAL | 1 refills | Status: DC

## 2020-06-25 MED ORDER — BUPROPION HCL ER (XL) 150 MG PO TB24: 450.0000 mg | ORAL_TABLET | Freq: Every day | ORAL | 3 refills | Status: DC

## 2020-06-25 NOTE — Addendum Note (Signed)
Addended by: Gweneth Dimitri R on: 06/25/2020 12:25 PM   Modules accepted: Orders

## 2020-07-19 ENCOUNTER — Encounter: Payer: 59 | Admitting: Family Medicine

## 2020-07-20 ENCOUNTER — Ambulatory Visit: Payer: 59 | Admitting: Obstetrics and Gynecology

## 2020-07-27 ENCOUNTER — Encounter: Payer: 59 | Admitting: Family Medicine

## 2020-08-04 ENCOUNTER — Other Ambulatory Visit: Payer: Self-pay

## 2020-08-04 ENCOUNTER — Encounter: Payer: Self-pay | Admitting: Obstetrics and Gynecology

## 2020-08-04 ENCOUNTER — Ambulatory Visit (INDEPENDENT_AMBULATORY_CARE_PROVIDER_SITE_OTHER): Payer: 59 | Admitting: Obstetrics and Gynecology

## 2020-08-04 VITALS — BP 139/87 | HR 101 | Ht 61.0 in | Wt 224.5 lb

## 2020-08-04 DIAGNOSIS — N946 Dysmenorrhea, unspecified: Secondary | ICD-10-CM | POA: Diagnosis not present

## 2020-08-04 DIAGNOSIS — Z30432 Encounter for removal of intrauterine contraceptive device: Secondary | ICD-10-CM

## 2020-08-04 NOTE — Progress Notes (Signed)
HPI:      [name] is a 34 y.o. G1P1001 who LMP was No LMP recorded. (Menstrual status: IUD).  Subjective:   She presents today she states that the IUD "no longer seems to be working for her".  She is having painful menstrual periods and sometimes has two 5-day periods per month.  She was initially using the IUD for cycle control and cramping control.  She has had both fallopian tubes removed so does not need the IUD for birth control.  She would just like it removed today.    Hx: The following portions of the patient's history were reviewed and updated as appropriate:             She  has a past medical history of Anemia, Breast lump (2014), Family history of adverse reaction to anesthesia, Headache, Hypothyroidism, Thyroid disease, and Vitamin D deficiency. She does not have any pertinent problems on file. She  has a past surgical history that includes Cesarean section (2014); Fetal blood transfusion (2010); Cholecystectomy (2012); Foot surgery (2013); Hysteroscopy with D & C (N/A, 09/24/2017); laparoscopy (N/A, 09/24/2017); Laparoscopic tubal ligation (Bilateral, 09/24/2017); and Tubal ligation. Her family history includes Alcohol abuse in her mother; Anxiety disorder in her father, sister, and sister; Bipolar disorder in her sister; COPD in her paternal grandmother; Charcot-Marie-Tooth disease in her sister; Cirrhosis in her mother; Depression in her sister; Diabetes in her maternal grandfather and mother; Drug abuse in her mother and sister; GER disease in her son; Heart attack (age of onset: 58) in her maternal grandfather; Hypertension in her mother and paternal grandmother; Lung cancer in her paternal grandfather; Stroke (age of onset: 48) in her maternal grandmother. She  reports that she has never smoked. She has never used smokeless tobacco. She reports current alcohol use. She reports that she does not use drugs. She has a current medication list which includes the following  prescription(s): albuterol, bupropion, vitamin d, ibuprofen, levonorgestrel, and levothyroxine. She has No Known Allergies.       Review of Systems:  Review of Systems  Constitutional: Denied constitutional symptoms, night sweats, recent illness, fatigue, fever, insomnia and weight loss.  Eyes: Denied eye symptoms, eye pain, photophobia, vision change and visual disturbance.  Ears/Nose/Throat/Neck: Denied ear, nose, throat or neck symptoms, hearing loss, nasal discharge, sinus congestion and sore throat.  Cardiovascular: Denied cardiovascular symptoms, arrhythmia, chest pain/pressure, edema, exercise intolerance, orthopnea and palpitations.  Respiratory: Denied pulmonary symptoms, asthma, pleuritic pain, productive sputum, cough, dyspnea and wheezing.  Gastrointestinal: Denied, gastro-esophageal reflux, melena, nausea and vomiting.  Genitourinary: See HPI for additional information.  Musculoskeletal: Denied musculoskeletal symptoms, stiffness, swelling, muscle weakness and myalgia.  Dermatologic: Denied dermatology symptoms, rash and scar.  Neurologic: Denied neurology symptoms, dizziness, headache, neck pain and syncope.  Psychiatric: Denied psychiatric symptoms, anxiety and depression.  Endocrine: Denied endocrine symptoms including hot flashes and night sweats.   Meds:   Current Outpatient Medications on File Prior to Visit  Medication Sig Dispense Refill  . albuterol (VENTOLIN HFA) 108 (90 Base) MCG/ACT inhaler Inhale 2 puffs into the lungs every 6 (six) hours as needed for wheezing or shortness of breath. 8 g 1  . buPROPion (WELLBUTRIN XL) 150 MG 24 hr tablet Take 3 tablets (450 mg total) by mouth daily. 270 tablet 3  . Cholecalciferol (VITAMIN D) 50 MCG (2000 UT) tablet Take 2,000 Units by mouth daily.    Marland Kitchen ibuprofen (ADVIL,MOTRIN) 800 MG tablet Take 1 tablet (800 mg total) by mouth 3 (  three) times daily. 30 tablet 1  . levonorgestrel (MIRENA) 20 MCG/24HR IUD 1 each by Intrauterine  route once.    Marland Kitchen levothyroxine (SYNTHROID) 75 MCG tablet TAKE 1 TABLET(75 MCG) BY MOUTH DAILY BEFORE BREAKFAST 90 tablet 1   No current facility-administered medications on file prior to visit.      Objective:     Vitals:   08/04/20 1454  BP: 139/87  Pulse: (!) 101   Filed Weights   08/04/20 1454  Weight: 224 lb 8 oz (101.8 kg)              Physical examination   Pelvic:   Vulva: Normal appearance.  No lesions.  Vagina: No lesions or abnormalities noted.  Support: Normal pelvic support.  Urethra No masses tenderness or scarring.  Meatus Normal size without lesions or prolapse.  Cervix: Normal appearance.  No lesions. IUD strings noted at cervical os.  Anus: Normal exam.  No lesions.  Perineum: Normal exam.  No lesions.        Bimanual   Uterus: Normal size.  Non-tender.  Mobile.  AV.  Adnexae: No masses.  Non-tender to palpation.  Cul-de-sac: Negative for abnormality.   IUD Removal Strings of IUD identified and grasped.  IUD removed without problem.  Pt tolerated this well.  IUD noted to be intact.     Assessment:    G1P1001 Patient Active Problem List   Diagnosis Date Noted  . Long term (current) use of hormonal contraceptives 06/23/2019  . Insomnia 06/23/2019  . Adjustment disorder with mixed anxiety and depressed mood 06/23/2019  . Vitamin D deficiency 06/20/2018  . Endometriosis determined by laparoscopy 09/24/2017  . Status post laparoscopic bilateral salpingectomy/excision and fulguration of endometriosis 09/24/2017  . PCO (polycystic ovaries) 08/14/2017  . Status post hysteroscopy/D&C 08/14/2017  . Morbid obesity (HCC) 08/14/2017  . History of blood transfusion 08/14/2017  . Menorrhagia with irregular cycle 08/14/2017  . Other specified hypothyroidism 04/25/2010  . ANEMIA-IRON DEFICIENCY 04/25/2010  . IRON DEFICIENCY ANEMIA, HX OF 04/25/2010     1. Encounter for IUD removal   2. Dysmenorrhea        Plan:            1.  Patient does not  desire birth control at this time.  We have discussed the use of NSAID S the first 2 days of her menstrual period to try to cut down on dysmenorrhea.  Orders No orders of the defined types were placed in this encounter.   No orders of the defined types were placed in this encounter.     F/U  Return for Annual Physical. I spent 20 minutes involved in the care of this patient preparing to see the patient by obtaining and reviewing her medical history (including labs, imaging tests and prior procedures), documenting clinical information in the electronic health record (EHR), counseling and coordinating care plans, writing and sending prescriptions, ordering tests or procedures and directly communicating with the patient by discussing pertinent items from her history and physical exam as well as detailing my assessment and plan as noted above so that she has an informed understanding.  All of her questions were answered.  Elonda Husky, M.D. 08/04/2020 3:09 PM

## 2020-08-05 ENCOUNTER — Ambulatory Visit (INDEPENDENT_AMBULATORY_CARE_PROVIDER_SITE_OTHER): Payer: 59 | Admitting: Family Medicine

## 2020-08-05 ENCOUNTER — Encounter: Payer: Self-pay | Admitting: Family Medicine

## 2020-08-05 VITALS — BP 118/78 | HR 85 | Temp 98.3°F | Ht 61.0 in | Wt 225.2 lb

## 2020-08-05 DIAGNOSIS — Z23 Encounter for immunization: Secondary | ICD-10-CM

## 2020-08-05 DIAGNOSIS — E038 Other specified hypothyroidism: Secondary | ICD-10-CM

## 2020-08-05 DIAGNOSIS — Z0000 Encounter for general adult medical examination without abnormal findings: Secondary | ICD-10-CM

## 2020-08-05 DIAGNOSIS — Z1159 Encounter for screening for other viral diseases: Secondary | ICD-10-CM

## 2020-08-05 LAB — TSH: TSH: 6.1 u[IU]/mL — ABNORMAL HIGH (ref 0.35–4.50)

## 2020-08-05 NOTE — Patient Instructions (Signed)
Sleep hygiene checklist: ?1. Avoid naps during the day ?2. Avoid stimulants such as caffeine and nicotine. Avoid bedtime alcohol (it can speed onset of sleep but the body's metabolism can cause awakenings). At least 2 hours before bedtime ?3. All forms of exercise help ensure sound sleep - limit vigorous exercise to morning or late afternoon ?4. Avoid food too close to bedtime including chocolate (which contains caffeine) ?5. Soak up natural light ?6. Establish regular bedtime routine. ?7. Associate bed with sleep - avoid TV, computer or phone, reading while in bed. ?8. Ensure pleasant, relaxing sleep environment - quiet, dark, cool room. ? ?Good Sleep Hygiene Habits ?-- Got to bed and wake up within an hour of the same time every day ?-- Avoid bright screens (from laptop, phone, TV) within at least 30 minutes before bed. The "blue light" supresses the sleep hormone melatonin and the content may stimulate as well ?-- Maintain a quiet and dark sleep environment (blackout curtains, turn on a fan or white noise to block out disruptive sounds) ?-- Practicing relaxing activites before bed (taking a shower, reading a book, journaling, meditation app) ?-- To quiet a busy mind -- consider journaling before bed (jotting down reminders, worry thoughts, as well as positive things like a gratitude list) ? ? ?Begin a Mindfulness/Meditation practice -- this can take a little as 3 minutes ?-- You can find resources in books ?-- Or you can download apps like  ?---- Headspace App (which currently has free content called "Weathering the Storm") ?---- Calm (which has a few free options)  ?---- Insignt Timer ?---- Stop, Breathe & Think ? ?# With each of these Apps - you should decline the "start free trial" offer and as you search through the App should be able to access some of their free content. You can also chose to pay for the content if you find one that works well for you.  ? ?# Many of them also offer sleep specific content  which may help with insomnia ? ?

## 2020-08-05 NOTE — Progress Notes (Signed)
Annual Exam   Chief Complaint:  Chief Complaint  Patient presents with  . Annual Exam    History of Present Illness:  [name] is a 34 y.o. G1P1001 who LMP was Patient's last menstrual period was 08/01/2020 (exact date)., presents today for her annual examination.      Nutrition/Lifestyle Diet: counting calories, skipping meals some days and other days eating all day, trying to make healthy choices Exercise: walking or 30 minute work-up, most days She does get adequate calcium and Vitamin D in her diet.  Social History   Tobacco Use  Smoking Status Never Smoker  Smokeless Tobacco Never Used   Social History   Substance and Sexual Activity  Alcohol Use Yes   Comment: 2 times once a week or every other week   Social History   Substance and Sexual Activity  Drug Use No     Safety The patient wears seatbelts: yes.     The patient feels safe at home and in their relationships: yes.  General Health Dentist in the last year: Yes Eye doctor: yes  Menstrual Getting 2 cycles a month. Follows with GYN  GYN She is single partner, contraception - tubal ligation.     Cervical Cancer Screening (Age 52-65) Last Pap:  December 2020 Results were: no abnormalities with HPV negative    Weight Wt Readings from Last 3 Encounters:  08/05/20 225 lb 4 oz (102.2 kg)  08/04/20 224 lb 8 oz (101.8 kg)  05/11/20 225 lb 4 oz (102.2 kg)   Patient has very high BMI  BMI Readings from Last 1 Encounters:  08/05/20 42.56 kg/m     Chronic disease screening Blood pressure monitoring:  BP Readings from Last 3 Encounters:  08/05/20 118/78  08/04/20 139/87  01/15/20 122/72     Lipid Monitoring: Indication for screening: age >32, obesity, diabetes, family hx, CV risk factors.  Lipid screening: Yes  Lab Results  Component Value Date   CHOL 162 06/23/2019   HDL 40.40 06/23/2019   LDLCALC 97 06/23/2019   LDLDIRECT 68.4 07/28/2010   TRIG 122.0 06/23/2019    CHOLHDL 4 06/23/2019     Diabetes Screening: age >47, overweight, family hx, PCOS, hx of gestational diabetes, at risk ethnicity, elevated blood pressure >135/80.  Diabetes Screening screening: Yes  Lab Results  Component Value Date   HGBA1C 4.9 10/04/2016      Past Medical History:  Diagnosis Date  . Anemia    iron deficiency  . Breast lump 2014  . Family history of adverse reaction to anesthesia    MOM-HARD TO WAKE UP  . Headache    MIGRAINES  . Hypothyroidism   . Thyroid disease   . Vitamin D deficiency     Past Surgical History:  Procedure Laterality Date  . CESAREAN SECTION  2014  . CHOLECYSTECTOMY  2012  . FETAL BLOOD TRANSFUSION  2010  . FOOT SURGERY  2013  . HYSTEROSCOPY WITH D & C N/A 09/24/2017   Procedure: DILATATION AND CURETTAGE /HYSTEROSCOPY;  Surgeon: Defrancesco, Prentice Docker, MD;  Location: [facility] OR[facility];  Service: Gynecology;  Laterality: N/A;  . LAPAROSCOPIC TUBAL LIGATION Bilateral 09/24/2017   Procedure: LAPAROSCOPIC BILATERAL TUBAL LIGATION;  Surgeon: Herold Harms, MD;  Location: [facility];  Service: Gynecology;  Laterality: Bilateral;  . LAPAROSCOPY N/A 09/24/2017   Procedure: LAPAROSCOPY DIAGNOSTIC;  Surgeon: Herold Harms, MD;  Location: [facility];  Service: Gynecology;  Laterality: N/A;  . TUBAL LIGATION      Prior  to Admission medications   Medication Sig Start Date End Date Taking? Authorizing Provider  albuterol (VENTOLIN HFA) 108 (90 Base) MCG/ACT inhaler Inhale 2 puffs into the lungs every 6 (six) hours as needed for wheezing or shortness of breath. 11/30/17  Yes Emi Belfast, FNP  buPROPion (WELLBUTRIN XL) 150 MG 24 hr tablet Take 3 tablets (450 mg total) by mouth daily. 06/25/20  Yes Lynnda Child, MD  Cholecalciferol (VITAMIN D) 50 MCG (2000 UT) tablet Take 2,000 Units by mouth daily.   Yes [provider]  ibuprofen (ADVIL,MOTRIN) 800 MG tablet Take 1 tablet (800 mg total) by mouth 3 (three) times daily. 09/24/17   Yes Defrancesco, Prentice Docker, MD  levothyroxine (SYNTHROID) 75 MCG tablet TAKE 1 TABLET(75 MCG) BY MOUTH DAILY BEFORE BREAKFAST 06/03/20  Yes Lynnda Child, MD    No Known Allergies  Gynecologic History: Patient's last menstrual period was 08/01/2020 (exact date).  Obstetric History: G1P1001  Social History   Socioeconomic History  . Marital status: Married    Spouse name: Josh  . Number of children: 1  . Years of education: Some College  . Highest education level: Not on file  Occupational History  . Occupation: Administrative advantage  Tobacco Use  . Smoking status: Never Smoker  . Smokeless tobacco: Never Used  Vaping Use  . Vaping Use: Never used  Substance and Sexual Activity  . Alcohol use: Yes    Comment: 2 times once a week or every other week  . Drug use: No  . Sexual activity: Yes    Birth control/protection: I.U.D., Surgical  Other Topics Concern  . 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 Difficulty of Paying Living Expenses: Not on file  Food Insecurity:   . Worried About Programme researcher, broadcasting/film/video in the Last Year: Not on file  . Ran Out of Food in the Last Year: Not on file  Transportation Needs:   . Lack of Transportation (Medical): Not on file  . Lack of Transportation (Non-Medical): Not on file  Physical Activity:   . Days of Exercise per Week: Not on file  . Minutes of Exercise per Session: Not on file  Stress:   . Feeling of Stress : Not on file  Social  Connections:   . Frequency of Communication with Friends and Family: Not on file  . Frequency of Social Gatherings with Friends and Family: Not on file  . Attends Religious Services: Not on file  . Active Member of Clubs or Organizations: Not on file  . Attends Banker Meetings: Not on file  . Marital Status: Not on file  Intimate Partner Violence:   . Fear of Current or Ex-Partner: Not on file  . Emotionally Abused: Not on file  . Physically Abused: Not on file  .  Sexually Abused: Not on file    Family History  Problem Relation Age of Onset  . Hypertension Mother   . Diabetes Mother   . Alcohol abuse Mother   . Drug abuse Mother   . Cirrhosis Mother   . Anxiety disorder Father   . Bipolar disorder Sister   . Anxiety disorder Sister   . Depression Sister   . Charcot-Marie-Tooth disease Sister   . GER disease Son   . Stroke Maternal Grandmother 60  . Diabetes Maternal Grandfather   . Heart attack Maternal Grandfather 65  . Hypertension Paternal Grandmother   . COPD Paternal Grandmother   . Lung cancer Paternal Grandfather   . Anxiety disorder Sister   . Drug abuse Sister   . Breast cancer Neg Hx   . Ovarian cancer Neg Hx   . Colon cancer Neg Hx     Review of Systems  Constitutional: Negative for chills and fever.  HENT: Negative for congestion and sore throat.   Eyes: Negative for blurred vision and double vision.  Respiratory: Negative for shortness of breath.   Cardiovascular: Negative for chest pain.  Gastrointestinal: Negative for heartburn, nausea and vomiting.  Genitourinary: Negative.   Musculoskeletal: Negative.  Negative for myalgias.  Skin: Negative for rash.  Neurological: Negative for dizziness and headaches.  Endo/Heme/Allergies: Does not bruise/bleed easily.  Psychiatric/Behavioral: Negative for depression. The patient is not nervous/anxious.      Physical Exam BP 118/78   Pulse 85   Temp 98.3 F (36.8 C) (Temporal)   Ht 5\' 1"   (1.549 m)   Wt 225 lb 4 oz (102.2 kg)   LMP 08/01/2020 (Exact Date)   SpO2 98%   BMI 42.56 kg/m    BP Readings from Last 3 Encounters:  08/05/20 118/78  08/04/20 139/87  01/15/20 122/72    Wt Readings from Last 3 Encounters:  08/05/20 225 lb 4 oz (102.2 kg)  08/04/20 224 lb 8 oz (101.8 kg)  05/11/20 225 lb 4 oz (102.2 kg)     Physical Exam Constitutional:      General: She is not in acute distress.    Appearance: She is well-developed. She is not diaphoretic.  HENT:     Head: Normocephalic and atraumatic.     Right Ear: External ear normal.     Left Ear: External ear normal.     Nose: Nose normal.  Eyes:     General: No scleral icterus.    Conjunctiva/sclera: Conjunctivae normal.  Cardiovascular:     Rate and Rhythm: Normal rate and regular rhythm.     Heart sounds: No murmur heard.   Pulmonary:     Effort: Pulmonary effort is normal. No respiratory distress.     Breath sounds: Normal breath sounds. No wheezing.  Abdominal:     General: Bowel sounds are normal. There is no distension.     Palpations: Abdomen is soft. There is no mass.     Tenderness: There is no abdominal tenderness. There is no guarding or rebound.  Musculoskeletal:        General: Normal range of motion.     Cervical back: Neck supple.  Lymphadenopathy:     Cervical: No cervical adenopathy.  Skin:    General: Skin is warm and dry.     Capillary Refill: Capillary refill takes less than 2 seconds.  Neurological:     Mental Status: She is alert and oriented to person, place, and time.     Deep Tendon Reflexes: Reflexes  normal.  Psychiatric:        Behavior: Behavior normal.       Results: Depression screen [facility] 2/9 08/05/2020 06/23/2019 06/20/2018  Decreased Interest 0 2 0  Down, Depressed, Hopeless 0 0 0  PHQ - 2 Score 0 2 0  Altered sleeping 3 1 2   Tired, decreased energy 2 1 2   Change in appetite 1 2 0  Feeling bad or failure about yourself  0 2 0  Trouble concentrating 1 1 0   Moving slowly or fidgety/restless 0 0 0  Suicidal thoughts 0 0 0  PHQ-9 Score 7 9 4   Difficult doing work/chores Not difficult at all Somewhat difficult Somewhat difficult      Assessment: 34 y.o. G16P1001 female here for routine annual examination.  Plan: Problem List Items Addressed This Visit      Endocrine   Other specified hypothyroidism   Relevant Orders   TSH     Other   Morbid obesity (HCC)   Relevant Orders   Amb ref to Medical Nutrition Therapy-MNT    Other Visit Diagnoses    Annual physical exam    -  Primary   Need for influenza vaccination       Relevant Orders   Flu Vaccine QUAD 36+ mos IM (Completed)   Need for hepatitis C screening test       Relevant Orders   Hepatitis C antibody       Screening: -- Blood pressure screen normal -- cholesterol screening: not due -- Weight screening: obese: discussed management options, including lifestyle, dietary, and exercise. -- Diabetes Screening: not due for screening -- Nutrition: encouraged healthy diet   Psych -- Depression screening (PHQ-9):    Office Visit from 08/05/2020 in [facility] Total Score 7       Safety -- tobacco screening: not using -- alcohol screening:  low-risk usage. -- no evidence of domestic violence or intimate partner violence.  Cancer Screening -- pap smear not collected per ASCCP guidelines -- family history of breast cancer screening: done. not at high risk.   Immunizations Immunization History  Administered Date(s) Administered  . Influenza, Seasonal, Injecte, Preservative Fre 07/01/2015, 08/29/2016  . Influenza,inj,Quad PF,6+ Mos 07/08/2013, 07/15/2014, 06/23/2019, 08/05/2020  . Influenza-Unspecified 07/08/2017, 09/16/2017, 07/08/2018  . Td 04/25/2010    -- flu vaccine up to date -- TDAP q10 years up to date -- Covid-19 Vaccine unknown, record requested  Encouraged regular vision and dental screening. Encouraged healthy exercise  and diet.   09/07/2018

## 2020-08-06 LAB — HEPATITIS C ANTIBODY
Hepatitis C Ab: NONREACTIVE
SIGNAL TO CUT-OFF: 0.01 (ref <1.00)

## 2020-08-09 MED ORDER — LEVOTHYROXINE SODIUM 88 MCG PO TABS: 88.0000 ug | ORAL_TABLET | Freq: Every day | ORAL | 0 refills | Status: DC

## 2020-08-30 ENCOUNTER — Ambulatory Visit: Payer: 59 | Admitting: Dietician

## 2020-09-09 ENCOUNTER — Encounter: Payer: 59 | Attending: Family Medicine | Admitting: Dietician

## 2020-09-09 ENCOUNTER — Other Ambulatory Visit: Payer: Self-pay

## 2020-09-09 ENCOUNTER — Encounter: Payer: Self-pay | Admitting: Dietician

## 2020-09-09 VITALS — Ht 61.0 in | Wt 225.5 lb

## 2020-09-09 NOTE — Progress Notes (Signed)
Medical Nutrition Therapy: Visit start time: 1515  end time: 1630  Assessment:  Diagnosis: obesity Past medical history: cholecystectomy; hypothyroidism Psychosocial issues/ stress concerns: anxiety  Preferred learning method:  . Auditory . Hands-on   Current weight: 225.5lbs with shoes  Height: 5'1" Medications, supplements: reconciled list in medical record  Progress and evaluation:   In the past, patient has done [employer], 21-day fix program, shake-ology (which worked but difficult to maintain).   Was close to goal of 180 in 2017; injured back and had thyroid issues and began regaining weight.  Has been recently increasing veg and some fruits, limiting starches, and has increased exercise.   Reports symptoms of lactose intolerance, now avoiding dairy foods.  Reports feeling some symptoms including flare-up of psoriases when eating large amounts of carbs such as bread.  Tries to pre-plan meals at home; works from [employer] often for family outings; will work to make healthy choices on the road.  Physical activity: cardio / HIIT 30-40 minutes 4-5 times a week; hiking/ camping/ kayaking family outings  Dietary Intake:  Usual eating pattern includes 3 meals and 1-2 snacks per day. Dining out frequency: 1-2 meals per week.  Breakfast: 1/2 grapfruit with eggs or bacon; whole grain wrap with eggs; boiled eggs; oatmeal with water or lactose free milk + fruit and occ honey Snack: occasionally greek yogurt with frozen fruit blueberries, pineapple or apples Lunch: low-carb wrap with ham, mayo, and lettuce; leftovers   Snack: peanut butter with banana; homemade oatmeal banana muffins Supper: meat balls with bbq sauce and brussels sprouts or green beans; trying new recipes/ meals such as "egg roll in a bowl"  Snack: none Beverages: water/ flavored wataer, 10-20oz coffee with 1-2Tbsp sugar free creamer; occ green tea no sugar; powerade zero; rarely ginger ale or sparkling water  or diet coke  Nutrition Care Education: Topics covered:  Basic nutrition: basic food groups, appropriate nutrient balance, appropriate meal and snack schedule, general nutrition guidelines    Weight control: identifying healthy weight, determining reasonable weight loss rate, importance of low sugar and low fat choices, portion control and meal planning using plate method, estimated energy needs for weight loss at 1400-1500 kcal, provided guidance for 40% CHO, 30% pro, 30% fat per patient preference; discussed options for tracking intake; role of physical activity; discussed Mediterranean eating pattern as one of the healthiest options Advanced nutrition: cooking techniques   Nutritional Diagnosis:  [place]-3.3 Overweight/obesity As related to history of excess calories and inadequate physical activity, hypothroidism.  As evidenced by patient with current BMI of 42, working on diet and lifestyle changes to promote weight loss.  Intervention:  . Instruction and discussion as noted above. . Patient is making healthy and positive changes and is motivated to continue. . Established additional goals for change with direction from patient.   Education Materials given:  . Plate Planner with food lists, sample meal pattern . Sample menus . Mediterranean diet overview . Goals/ instructions   Learner/ who was taught:  . Patient   Level of understanding: Marland Kitchen Verbalizes/ demonstrates competency   Demonstrated degree of understanding via:   Teach back Learning barriers: . None  Willingness to learn/ readiness for change: . Eager, change in progress   Monitoring and Evaluation:  Dietary intake, exercise, and body weight      follow up: 11/10/20 at 3:15pm

## 2020-09-09 NOTE — Patient Instructions (Addendum)
   Take weight loss step by step-- work on 10-15lbs, then reevaluate how you feel and realistic goal.   Allow generous portions of low-carb vegetables with meals, and include controlled portions of starches and lean protein foods.   Continue with healthy choices and regular exercise, great job!  Use a method of tracking food intake, either pen and paper, or an app such as MyFitnessPal or Lose It

## 2020-09-16 ENCOUNTER — Encounter: Payer: Self-pay | Admitting: Family Medicine

## 2020-09-16 ENCOUNTER — Other Ambulatory Visit: Payer: Self-pay

## 2020-09-16 ENCOUNTER — Other Ambulatory Visit (INDEPENDENT_AMBULATORY_CARE_PROVIDER_SITE_OTHER): Payer: 59

## 2020-09-16 ENCOUNTER — Other Ambulatory Visit: Payer: Self-pay | Admitting: Family Medicine

## 2020-09-16 DIAGNOSIS — E038 Other specified hypothyroidism: Secondary | ICD-10-CM

## 2020-09-16 LAB — TSH: TSH: 3.68 u[IU]/mL (ref 0.35–4.50)

## 2020-09-16 MED ORDER — LEVOTHYROXINE SODIUM 88 MCG PO TABS: 88.0000 ug | ORAL_TABLET | Freq: Every day | ORAL | 3 refills | Status: DC

## 2020-10-08 ENCOUNTER — Telehealth: Payer: Self-pay | Admitting: Dietician

## 2020-10-08 NOTE — Telephone Encounter (Signed)
Patient called to confirm/ reschedule her follow-up MNT appointment. Now scheduled for 11/19/20 at 2:15pm.

## 2020-10-27 ENCOUNTER — Encounter: Payer: 59 | Admitting: Obstetrics and Gynecology

## 2020-11-19 ENCOUNTER — Ambulatory Visit: Payer: 59 | Admitting: Dietician

## 2020-11-24 ENCOUNTER — Encounter: Payer: Self-pay | Admitting: Family Medicine

## 2020-12-31 ENCOUNTER — Encounter: Payer: Self-pay | Admitting: Dietician

## 2020-12-31 NOTE — Progress Notes (Signed)
Have not heard back from patient to reschedule her cancelled appointment from 11/19/20. Sent notification to referring provider. 

## 2021-04-18 ENCOUNTER — Telehealth: Payer: Self-pay | Admitting: Family Medicine

## 2021-04-18 NOTE — Telephone Encounter (Signed)
Noted agree with evaluation.

## 2021-04-18 NOTE — Telephone Encounter (Signed)
Spoke to patient by telephone and was advised that she has an appointment today 04/18/21 at [facility] in [place] at 3:30 pm.

## 2021-04-18 NOTE — Telephone Encounter (Signed)
Left message on voicemail for patient to call the office back. 

## 2021-04-18 NOTE — Telephone Encounter (Signed)
[name] called in due to she is suffering from allergies and the congestion has settled in her and her son (Kimberly Garner) and cant see sleep and she feels like she is wheezing. And with her son Kimberly he only cough when he gets hot.  And wanted to know if its something they can take at home

## 2021-04-18 NOTE — Telephone Encounter (Signed)
PLEASE NOTE: All timestamps contained within this report are represented as [place] Standard Time. CONFIDENTIALTY NOTICE: This fax transmission is intended only for the addressee. It contains information that is legally privileged, confidential or otherwise protected from use or disclosure. If you are not the intended recipient, you are strictly prohibited from reviewing, disclosing, copying using or disseminating any of this information or taking any action in reliance on or regarding this information. If you have received this fax in error, please notify us immediately by telephone so that we can arrange for its return to [place]. Phone: [phone], Toll-Free: [phone], Fax: [phone] Page: 1 of 2 Call Id: [id number] [facility] Day - Client TELEPHONE ADVICE RECORD AccessNurse Patient Name: Kimberly Garner Gender: Female DOB: 05-21-1986 Age: 34 Y 1 M 14 D Return Phone Number: [phone] (Primary) Address: [place]/ [place]/ [place] [address] Client [facility] Day - Client Client Site [facility] - Day Physician Gweneth Dimitri- MD Contact Type Call Who Is Calling Patient / Member / Family / Caregiver Call Type Triage / Clinical Relationship To Patient Self Return Phone Number [phone] (Primary) Chief Complaint WHEEZING Reason for Call Symptomatic / Request for Health Information Initial Comment Caller states her son and her have congestion. She is wheezing and it has settled in her chest. Call was transferred from the office. GOTO Facility Not Listed Next care UC Translation No Nurse Assessment Nurse: Suezanne Jacquet, RN, Riley Lam Date/Time (Eastern Time): 04/18/2021 9:21:53 AM Confirm and document reason for call. If symptomatic, describe symptoms. ---Caller states her son and her have congestion. She is wheezing and it has settled in her chest. Symptoms began a weeks ago. Does the patient have any new or  worsening symptoms? ---Yes Will a triage be completed? ---Yes Related visit to physician within the last 2 weeks? ---No Does the PT have any chronic conditions? (i.e. diabetes, asthma, this includes High risk factors for pregnancy, etc.) ---No Is the patient pregnant or possibly pregnant? (Ask all females between the ages of 53-55) ---No Is this a behavioral health or substance abuse call? ---No Guidelines Guideline Title Affirmed Question Affirmed Notes Nurse Date/Time (Eastern Time) COVID-19 - Diagnosed or Suspected Chest pain or pressure Suezanne Jacquet, RN, Riley Lam 04/18/2021 9:23:30 AM Disp. Time Lamount Cohen Time) Disposition Final User 04/18/2021 9:20:55 AM Send to Urgent [facility] Cancer, Joni Reining PLEASE NOTE: All timestamps contained within this report are represented as [place] Standard Time. CONFIDENTIALTY NOTICE: This fax transmission is intended only for the addressee. It contains information that is legally privileged, confidential or otherwise protected from use or disclosure. If you are not the intended recipient, you are strictly prohibited from reviewing, disclosing, copying using or disseminating any of this information or taking any action in reliance on or regarding this information. If you have received this fax in error, please notify us immediately by telephone so that we can arrange for its return to [place]. Phone: [phone], Toll-Free: [phone], Fax: [phone] Page: 2 of 2 Call Id: [id number] 04/18/2021 9:26:37 AM Go to ED Now (or PCP triage) Geoffry Paradise, RN, [place] Spaniel Disagree/Comply Comply Caller Understands Yes PreDisposition Did not know what to do Care Advice Given Per Guideline GO TO ED NOW (OR PCP TRIAGE): CARE ADVICE given per COVID-19 - DIAGNOSED OR SUSPECTED (Adult) guideline. Referrals GO TO FACILITY OTHER - SPECIFY

## 2021-04-18 NOTE — Telephone Encounter (Signed)
Please triage patient.   It looks like access nurse is suggesting ER. This seems over what they may need. Virtual appointment  may be appropriate.

## 2021-08-01 ENCOUNTER — Other Ambulatory Visit: Payer: Self-pay | Admitting: Family Medicine

## 2021-08-01 DIAGNOSIS — F4323 Adjustment disorder with mixed anxiety and depressed mood: Secondary | ICD-10-CM

## 2021-09-28 ENCOUNTER — Other Ambulatory Visit: Payer: Self-pay

## 2021-09-28 ENCOUNTER — Ambulatory Visit (INDEPENDENT_AMBULATORY_CARE_PROVIDER_SITE_OTHER): Payer: 59

## 2021-09-28 DIAGNOSIS — Z23 Encounter for immunization: Secondary | ICD-10-CM

## 2021-10-03 ENCOUNTER — Telehealth: Payer: Self-pay | Admitting: Family Medicine

## 2021-10-03 DIAGNOSIS — E038 Other specified hypothyroidism: Secondary | ICD-10-CM

## 2021-10-03 DIAGNOSIS — F4323 Adjustment disorder with mixed anxiety and depressed mood: Secondary | ICD-10-CM

## 2021-10-03 NOTE — Telephone Encounter (Signed)
  Encourage patient to contact the pharmacy for refills or they can request refills through [facility]  LAST APPOINTMENT DATE:  Please schedule appointment if longer than 1 year  NEXT APPOINTMENT DATE:  MEDICATION:buPROPion (WELLBUTRIN XL) 150 MG 24 hr tablet   levothyroxine (SYNTHROID) 88 MCG tablet   Is the patient out of medication?   PHARMACY:[facility] [address] [place], [place] - [address]    Let patient know to contact pharmacy at the end of the day to make sure medication is ready.  Please notify patient to allow 48-72 hours to process  CLINICAL FILLS OUT ALL BELOW:   LAST REFILL:  QTY:  REFILL DATE:    OTHER COMMENTS:    Okay for refill?  Please advise

## 2021-10-06 ENCOUNTER — Other Ambulatory Visit: Payer: Self-pay | Admitting: Family Medicine

## 2021-10-06 ENCOUNTER — Other Ambulatory Visit: Payer: Self-pay

## 2021-10-06 DIAGNOSIS — F4323 Adjustment disorder with mixed anxiety and depressed mood: Secondary | ICD-10-CM

## 2021-10-06 DIAGNOSIS — E038 Other specified hypothyroidism: Secondary | ICD-10-CM

## 2021-10-06 MED ORDER — BUPROPION HCL ER (XL) 150 MG PO TB24: 150.0000 mg | ORAL_TABLET | Freq: Three times a day (TID) | ORAL | 0 refills | Status: DC

## 2021-10-06 MED ORDER — LEVOTHYROXINE SODIUM 88 MCG PO TABS: 88.0000 ug | ORAL_TABLET | Freq: Every day | ORAL | 0 refills | Status: DC

## 2021-10-06 NOTE — Telephone Encounter (Signed)
Pt called checking on the status of her medication. Pt states that she only have one pill left.

## 2021-10-06 NOTE — Telephone Encounter (Signed)
30 day refills given. Pt needs a medication refill appt within the next 30 days. No further refills until seen.

## 2021-10-06 NOTE — Telephone Encounter (Signed)
PT scheduled a O/V with Tabitha since Dr. Selena Batten is out on 2.16.22

## 2021-10-07 ENCOUNTER — Other Ambulatory Visit: Payer: Self-pay

## 2021-10-07 DIAGNOSIS — F4323 Adjustment disorder with mixed anxiety and depressed mood: Secondary | ICD-10-CM

## 2021-10-07 MED ORDER — BUPROPION HCL ER (XL) 150 MG PO TB24: 450.0000 mg | ORAL_TABLET | Freq: Every day | ORAL | 0 refills | Status: DC

## 2021-10-07 NOTE — Telephone Encounter (Signed)
Spoke to pharmacy and clarified dosage and rx sig. Rx filled.

## 2021-10-07 NOTE — Telephone Encounter (Signed)
[facility] called in to verify dosage of Rx buPROPion (WELLBUTRIN XL) 150 MG 24 hr tablet . Would like call back #[phone]

## 2021-10-14 ENCOUNTER — Ambulatory Visit (INDEPENDENT_AMBULATORY_CARE_PROVIDER_SITE_OTHER): Payer: 59 | Admitting: Family

## 2021-10-14 ENCOUNTER — Other Ambulatory Visit: Payer: Self-pay | Admitting: Family

## 2021-10-14 ENCOUNTER — Other Ambulatory Visit: Payer: Self-pay

## 2021-10-14 ENCOUNTER — Encounter: Payer: Self-pay | Admitting: Family

## 2021-10-14 VITALS — BP 110/76 | HR 91 | Temp 97.8°F | Ht 61.0 in | Wt 236.0 lb

## 2021-10-14 DIAGNOSIS — D649 Anemia, unspecified: Secondary | ICD-10-CM

## 2021-10-14 DIAGNOSIS — E559 Vitamin D deficiency, unspecified: Secondary | ICD-10-CM | POA: Diagnosis not present

## 2021-10-14 DIAGNOSIS — E038 Other specified hypothyroidism: Secondary | ICD-10-CM

## 2021-10-14 DIAGNOSIS — F4323 Adjustment disorder with mixed anxiety and depressed mood: Secondary | ICD-10-CM

## 2021-10-14 DIAGNOSIS — D509 Iron deficiency anemia, unspecified: Secondary | ICD-10-CM

## 2021-10-14 LAB — CBC WITH DIFFERENTIAL/PLATELET
Basophils Absolute: 0.1 10*3/uL (ref 0.0–0.1)
Basophils Relative: 0.9 % (ref 0.0–3.0)
Eosinophils Absolute: 0.1 10*3/uL (ref 0.0–0.7)
Eosinophils Relative: 1.1 % (ref 0.0–5.0)
HCT: 40.3 % (ref 36.0–46.0)
Hemoglobin: 13.8 g/dL (ref 12.0–15.0)
Lymphocytes Relative: 21.1 % (ref 12.0–46.0)
Lymphs Abs: 1.6 10*3/uL (ref 0.7–4.0)
MCHC: 34.2 g/dL (ref 30.0–36.0)
MCV: 91.6 fl (ref 78.0–100.0)
Monocytes Absolute: 0.6 10*3/uL (ref 0.1–1.0)
Monocytes Relative: 7.9 % (ref 3.0–12.0)
Neutro Abs: 5.2 10*3/uL (ref 1.4–7.7)
Neutrophils Relative %: 69 % (ref 43.0–77.0)
Platelets: 256 10*3/uL (ref 150.0–400.0)
RBC: 4.4 Mil/uL (ref 3.87–5.11)
RDW: 12.9 % (ref 11.5–15.5)
WBC: 7.5 10*3/uL (ref 4.0–10.5)

## 2021-10-14 LAB — VITAMIN D 25 HYDROXY (VIT D DEFICIENCY, FRACTURES): VITD: 25.5 ng/mL — ABNORMAL LOW (ref 30.00–100.00)

## 2021-10-14 LAB — TSH: TSH: 4.19 u[IU]/mL (ref 0.35–5.50)

## 2021-10-14 MED ORDER — LEVOTHYROXINE SODIUM 88 MCG PO TABS: 88.0000 ug | ORAL_TABLET | Freq: Every day | ORAL | 0 refills | Status: DC

## 2021-10-14 MED ORDER — VITAMIN D (ERGOCALCIFEROL) 1.25 MG (50000 UNIT) PO CAPS: 50000.0000 [IU] | ORAL_CAPSULE | ORAL | 0 refills | Status: AC

## 2021-10-14 MED ORDER — BUPROPION HCL ER (XL) 150 MG PO TB24: 450.0000 mg | ORAL_TABLET | Freq: Every day | ORAL | 0 refills | Status: DC

## 2021-10-14 MED ORDER — LEVOTHYROXINE SODIUM 100 MCG PO TABS: 100.0000 ug | ORAL_TABLET | Freq: Every day | ORAL | 1 refills | Status: DC

## 2021-10-14 NOTE — Patient Instructions (Addendum)
Look into Noom, this is super helpful with overeating/ and stress eating management.   Recommend you call your insurance and request to be referred for therapeutic counseling as this would be helping in managing your anxiety. Also check out psychologytoday.com and can search for a therapist that may be covered under your insurance.   Stop by the lab prior to leaving today. I will notify you of your results once received.  Continue levothyroxine as prescribed, we may titrate your dose based off of your lab results as you do seem to be symptomatic.   It was a pleasure seeing you today! Please do not hesitate to reach out with any questions and or concerns.  Regards,   Mort Sawyers FNP-C

## 2021-10-14 NOTE — Assessment & Plan Note (Signed)
TSH ordered, pending lab results. Continue current dose levothyroxine. May adjust based off of TSH drawn today at lab.

## 2021-10-14 NOTE — Assessment & Plan Note (Signed)
Vitamin d ordered today, pending results.

## 2021-10-14 NOTE — Assessment & Plan Note (Signed)
phq9 and GAD 7 reviewed. Cont current dose buproprion 450 mg once daily. Refill given for #90. Anxiety relieving techniques discussed in detail. Recommend pt establish care with a therapist for ongoing counseling.

## 2021-10-14 NOTE — Assessment & Plan Note (Signed)
Cbc ordered, pending results.

## 2021-10-14 NOTE — Progress Notes (Signed)
Established Patient Office Visit  Subjective:  Patient ID: Kimberly Garner, female    DOB: 1986-02-16  Age: 36 y.o. MRN: [id number]  CC:  Chief Complaint  Patient presents with   Medication Refill    HPI Kimberly Garner is here today for a follow up  Hypothyroid: Patient presents today for followup of hypothyroidism. Patient reports positive compliance with medications. Patient does have cold extremities and she really suffers with her weight. She doesn't feel overly fatigued.she does suffer from constipation.   Lab Results  Component Value Date   TSH 3.68 09/16/2020   Filed Weights   10/14/21 0801  Weight: 236 lb (107 kg)     Anxiety/depression: currently taking bupropion 450 mg, taking one daily. Doing well. Denies SI HI. Hard time of year as pt lost her mother to drugs/alcohol in 2016, she would be 35 y/o today. Denies cp palp and or sob. Did try zoloft in the past but it caused her to gain weight.she does work from home which lacks social aspect of life/ more isolated and so she finds she stays in the house a lot more often.,  PHQ9 SCORE ONLY 10/14/2021 10/14/2021 09/09/2020  PHQ-9 Total Score 10 1 0   GAD 7 : Generalized Anxiety Score 10/14/2021 06/23/2019 06/20/2018  Nervous, Anxious, on Edge 3 2 0  Control/stop worrying 3 3 0  Worry too much - different things 3 3 0  Trouble relaxing 1 2 0  Restless 3 2 0  Easily annoyed or irritable 0 2 0  Afraid - awful might happen 0 2 0  Total GAD 7 Score 13 16 0  Anxiety Difficulty - Somewhat difficult Not difficult at all     GAD 7 : Generalized Anxiety Score 10/14/2021 06/23/2019 06/20/2018  Nervous, Anxious, on Edge 3 2 0  Control/stop worrying 3 3 0  Worry too much - different things 3 3 0  Trouble relaxing 1 2 0  Restless 3 2 0  Easily annoyed or irritable 0 2 0  Afraid - awful might happen 0 2 0  Total GAD 7 Score 13 16 0  Anxiety Difficulty - Somewhat difficult Not difficult at all     Past Medical History:   Diagnosis Date   Anemia    iron deficiency   Breast lump 2014   Family history of adverse reaction to anesthesia    MOM-HARD TO WAKE UP   Headache    MIGRAINES   Hypothyroidism    Thyroid disease    Vitamin D deficiency     Past Surgical History:  Procedure Laterality Date   CESAREAN SECTION  2014   CHOLECYSTECTOMY  2012   FETAL BLOOD TRANSFUSION  2010   FOOT SURGERY  2013   HYSTEROSCOPY WITH D & C N/A 09/24/2017   Procedure: DILATATION AND CURETTAGE /HYSTEROSCOPY;  Surgeon: Herold Harms, MD;  Location: [facility];  Service: Gynecology;  Laterality: N/A;   LAPAROSCOPIC TUBAL LIGATION Bilateral 09/24/2017   Procedure: LAPAROSCOPIC BILATERAL TUBAL LIGATION;  Surgeon: Herold Harms, MD;  Location: [facility];  Service: Gynecology;  Laterality: Bilateral;   LAPAROSCOPY N/A 09/24/2017   Procedure: LAPAROSCOPY DIAGNOSTIC;  Surgeon: Herold Harms, MD;  Location: [facility];  Service: Gynecology;  Laterality: N/A;   TUBAL LIGATION      Family History  Problem Relation Age of Onset   Hypertension Mother    Diabetes Mother    Alcohol abuse Mother    Drug abuse Mother    Cirrhosis Mother  Anxiety disorder Father    Bipolar disorder Sister    Anxiety disorder Sister    Depression Sister    Charcot-Marie-Tooth disease Sister    GER disease Son    Stroke Maternal Grandmother 54   Diabetes Maternal Grandfather    Heart attack Maternal Grandfather 54   Hypertension Paternal Grandmother    COPD Paternal Grandmother    Lung cancer Paternal Grandfather    Anxiety disorder Sister    Drug abuse Sister    Breast cancer Neg Hx    Ovarian cancer Neg Hx    Colon cancer Neg Hx     Social History   Socioeconomic History   Marital status: Married    Spouse name: Josh   Number of children: 1   Years of education: Some College   Highest education level: Not on file  Occupational History   Occupation: Administrative advantage  Tobacco Use   Smoking status:  Never   Smokeless tobacco: Never  Vaping Use   Vaping Use: Never used  Substance and Sexual Activity   Alcohol use: Yes    Comment: 2 times once a week or every other week   Drug use: No   Sexual activity: Yes    Birth control/protection: I.U.D., Surgical  Other Topics Concern   Not on file  Social History Narrative   01/15/20   From: [place] originally - moved to live with Aunt when mom got sick   Living: with Sharia Reeve and son Mal Amabile   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, forensic      Family: has in-laws nearby with OK relationships      Enjoys: camping, hiking, fishing, swimming      Exercise: beach body on demand - 30 minutes a day   Diet: working on low calorie       Safety   Seat belts: Yes    Guns: Yes  and secure   Safe in relationships: Yes    Social Determinants of Corporate investment banker Strain: Not on file  Food Insecurity: Not on file  Transportation Needs: Not on file  Physical Activity: Not on file  Stress: Not on file  Social Connections: Not on file  Intimate Partner Violence: Not on file    Outpatient Medications Prior to Visit  Medication Sig Dispense Refill   albuterol (VENTOLIN HFA) 108 (90 Base) MCG/ACT inhaler Inhale 2 puffs into the lungs every 6 (six) hours as needed for wheezing or shortness of breath. 8 g 1   Cholecalciferol (VITAMIN D) 50 MCG (2000 UT) tablet Take 2,000 Units by mouth daily.     ibuprofen (ADVIL,MOTRIN) 800 MG tablet Take 1 tablet (800 mg total) by mouth 3 (three) times daily. 30 tablet 1   buPROPion (WELLBUTRIN XL) 150 MG 24 hr tablet Take 3 tablets (450 mg total) by mouth daily. 90 tablet 0   levothyroxine (SYNTHROID) 88 MCG tablet Take 1 tablet (88 mcg total) by mouth daily before breakfast. TAKE 1 TABLET(75 MCG) BY MOUTH DAILY BEFORE BREAKFAST 30 tablet 0   No facility-administered medications prior to visit.    No Known Allergies  ROS Review of Systems  Constitutional:  Positive for unexpected weight change (inability  to lose weight, seems to keep gaining). Negative for chills, fatigue and fever.  Respiratory:  Negative for shortness of breath.   Cardiovascular:  Negative for chest pain and palpitations.  Gastrointestinal:  Positive for constipation.  Endocrine: Positive for cold intolerance (cold extremities). Negative for polydipsia, polyphagia and  polyuria.  Musculoskeletal:  Positive for joint swelling (sometimes fingers).  Skin:  Negative for color change.  Psychiatric/Behavioral:  Negative for sleep disturbance and suicidal ideas. Self-injury: increased anxiety.The patient is nervous/anxious.      Objective:    Physical Exam Constitutional:      General: She is not in acute distress.    Appearance: Normal appearance. She is obese. She is not ill-appearing, toxic-appearing or diaphoretic.  HENT:     Head: Normocephalic.     Nose: Nose normal.  Cardiovascular:     Rate and Rhythm: Normal rate and regular rhythm.  Pulmonary:     Effort: Pulmonary effort is normal.     Breath sounds: Normal breath sounds.  Skin:    General: Skin is warm.  Neurological:     General: No focal deficit present.     Mental Status: She is alert.  Psychiatric:        Mood and Affect: Mood normal.        Behavior: Behavior normal.        Thought Content: Thought content normal.        Judgment: Judgment normal.     BP 110/76    Pulse 91    Temp 97.8 F (36.6 C) (Temporal)    Ht 5\' 1"  (1.549 m)    Wt 236 lb (107 kg)    LMP 09/26/2021 (Exact Date)    SpO2 98%    BMI 44.59 kg/m  Wt Readings from Last 3 Encounters:  10/14/21 236 lb (107 kg)  09/09/20 225 lb 8 oz (102.3 kg)  08/05/20 225 lb 4 oz (102.2 kg)     Health Maintenance Due  Topic Date Due   COVID-19 Vaccine (1) Never done   Pneumococcal Vaccine 65-51 Years old (1 - PCV) Never done   TETANUS/TDAP  04/25/2020    There are no preventive care reminders to display for this patient.  Lab Results  Component Value Date   TSH 3.68 09/16/2020   Lab  Results  Component Value Date   WBC 6.5 06/23/2019   HGB 14.2 06/23/2019   HCT 40.7 06/23/2019   MCV 92.7 06/23/2019   PLT 218.0 06/23/2019   Lab Results  Component Value Date   NA 137 06/23/2019   K 3.9 06/23/2019   CO2 25 06/23/2019   GLUCOSE 92 06/23/2019   BUN 13 06/23/2019   CREATININE 0.72 06/23/2019   BILITOT 0.6 06/23/2019   ALKPHOS 62 06/23/2019   AST 19 06/23/2019   ALT 14 06/23/2019   PROT 7.4 06/23/2019   ALBUMIN 4.6 06/23/2019   CALCIUM 9.3 06/23/2019   GFR 93.12 06/23/2019   Lab Results  Component Value Date   CHOL 162 06/23/2019   Lab Results  Component Value Date   HDL 40.40 06/23/2019   Lab Results  Component Value Date   LDLCALC 97 06/23/2019   Lab Results  Component Value Date   TRIG 122.0 06/23/2019   Lab Results  Component Value Date   CHOLHDL 4 06/23/2019   Lab Results  Component Value Date   HGBA1C 4.9 10/04/2016      Assessment & Plan:   Problem List Items Addressed This Visit       Endocrine   Other specified hypothyroidism    TSH ordered, pending lab results. Continue current dose levothyroxine. May adjust based off of TSH drawn today at lab.      Relevant Medications   levothyroxine (SYNTHROID) 88 MCG tablet   Other Relevant Orders  TSH     Other   ANEMIA-IRON DEFICIENCY    Cbc ordered, pending results.      Morbid obesity (HCC)    Work on a diet and exercise. D/w pt noom app, and checking that out to help with overeating tendencies.      Vitamin D deficiency    Vitamin d ordered today, pending results.      Adjustment disorder with mixed anxiety and depressed mood    phq9 and GAD 7 reviewed. Cont current dose buproprion 450 mg once daily. Refill given for #90. Anxiety relieving techniques discussed in detail. Recommend pt establish care with a therapist for ongoing counseling.      Relevant Medications   buPROPion (WELLBUTRIN XL) 150 MG 24 hr tablet   Other Visit Diagnoses     Vitamin D deficiency  disease    -  Primary   Relevant Orders   VITAMIN D 25 Hydroxy (Vit-D Deficiency, Fractures)   Anemia, unspecified type       Relevant Orders   CBC w/Diff       Meds ordered this encounter  Medications   buPROPion (WELLBUTRIN XL) 150 MG 24 hr tablet    Sig: Take 3 tablets (450 mg total) by mouth daily.    Dispense:  90 tablet    Refill:  0   levothyroxine (SYNTHROID) 88 MCG tablet    Sig: Take 1 tablet (88 mcg total) by mouth daily before breakfast. TAKE 1 TABLET(75 MCG) BY MOUTH DAILY BEFORE BREAKFAST    Dispense:  30 tablet    Refill:  0    Follow-up: Return in about 3 months (around 01/12/2022).    Mort Sawyers, FNP

## 2021-10-14 NOTE — Assessment & Plan Note (Signed)
Work on a diet and exercise. D/w pt noom app, and checking that out to help with overeating tendencies.

## 2021-10-16 ENCOUNTER — Encounter: Payer: Self-pay | Admitting: Family

## 2021-10-17 ENCOUNTER — Other Ambulatory Visit: Payer: Self-pay | Admitting: Family

## 2021-10-17 DIAGNOSIS — E038 Other specified hypothyroidism: Secondary | ICD-10-CM

## 2021-10-17 MED ORDER — LEVOTHYROXINE SODIUM 100 MCG PO TABS: 100.0000 ug | ORAL_TABLET | Freq: Every day | ORAL | 0 refills | Status: DC

## 2021-11-28 ENCOUNTER — Other Ambulatory Visit: Payer: 59

## 2021-12-03 ENCOUNTER — Other Ambulatory Visit: Payer: Self-pay | Admitting: Family

## 2021-12-03 DIAGNOSIS — F4323 Adjustment disorder with mixed anxiety and depressed mood: Secondary | ICD-10-CM

## 2021-12-06 ENCOUNTER — Encounter: Payer: Self-pay | Admitting: Family

## 2021-12-07 ENCOUNTER — Encounter: Payer: 59 | Admitting: Family Medicine

## 2022-01-04 ENCOUNTER — Other Ambulatory Visit: Payer: Self-pay

## 2022-01-04 ENCOUNTER — Ambulatory Visit (INDEPENDENT_AMBULATORY_CARE_PROVIDER_SITE_OTHER): Payer: Managed Care, Other (non HMO) | Admitting: Family Medicine

## 2022-01-04 VITALS — BP 110/68 | HR 88 | Temp 97.9°F | Ht 61.5 in | Wt 235.5 lb

## 2022-01-04 DIAGNOSIS — E038 Other specified hypothyroidism: Secondary | ICD-10-CM | POA: Diagnosis not present

## 2022-01-04 DIAGNOSIS — G4700 Insomnia, unspecified: Secondary | ICD-10-CM | POA: Diagnosis not present

## 2022-01-04 DIAGNOSIS — Z0000 Encounter for general adult medical examination without abnormal findings: Secondary | ICD-10-CM | POA: Diagnosis not present

## 2022-01-04 LAB — LIPID PANEL
Cholesterol: 162 mg/dL (ref 0–200)
HDL: 43.1 mg/dL (ref >39.00)
NonHDL: 119.3
Total CHOL/HDL Ratio: 4
Triglycerides: 214 mg/dL — ABNORMAL HIGH (ref 0.0–149.0)
VLDL: 42.8 mg/dL — ABNORMAL HIGH (ref 0.0–40.0)

## 2022-01-04 LAB — COMPREHENSIVE METABOLIC PANEL
ALT: 18 U/L (ref 0–35)
AST: 20 U/L (ref 0–37)
Albumin: 4.5 g/dL (ref 3.5–5.2)
Alkaline Phosphatase: 79 U/L (ref 39–117)
BUN: 13 mg/dL (ref 6–23)
CO2: 26 mEq/L (ref 19–32)
Calcium: 9.6 mg/dL (ref 8.4–10.5)
Chloride: 101 mEq/L (ref 96–112)
Creatinine, Ser: 0.85 mg/dL (ref 0.40–1.20)
GFR: 88.5 mL/min (ref >60.00)
Glucose, Bld: 84 mg/dL (ref 70–99)
Potassium: 4.4 mEq/L (ref 3.5–5.1)
Sodium: 135 mEq/L (ref 135–145)
Total Bilirubin: 0.5 mg/dL (ref 0.2–1.2)
Total Protein: 7.1 g/dL (ref 6.0–8.3)

## 2022-01-04 LAB — TSH: TSH: 1.73 u[IU]/mL (ref 0.35–5.50)

## 2022-01-04 LAB — LDL CHOLESTEROL, DIRECT: Direct LDL: 91 mg/dL

## 2022-01-04 NOTE — Progress Notes (Signed)
Annual Exam   Chief Complaint:  Chief Complaint  Patient presents with   Annual Exam    No concerns     History of Present Illness:  [name] is a 36 y.o. G1P1001 who LMP was Patient's last menstrual period was 12/22/2021 (exact date)., presents today for her annual examination.    Levothyroxine was increased - will get cold fingers/toes - tingling and cold even in the house - has noticed some improvement  Sleep is poor - staying asleep - hx of sleepwalking - husband noticed last night she got out of bed - will get out of bed and walking around the house - sometimes will lay in bed  Nutrition/Lifestyle Diet: meal planning sundays, same breakfast/lunch, healthy diet Exercise: beach body, 30 minutes in the morning She does get adequate calcium and Vitamin D in her diet.  Social History   Tobacco Use  Smoking Status Never  Smokeless Tobacco Never   Social History   Substance and Sexual Activity  Alcohol Use Yes   Comment: 2 times once a week or every other week   Social History   Substance and Sexual Activity  Drug Use No     Safety The patient wears seatbelts: yes.     The patient feels safe at home and in their relationships: yes.  General Health Dentist in the last year: Yes Eye doctor: yes  Menstrual Her menses are regular, improved with removing the IUD  GYN She is single partner, contraception - tubal ligation.    Cervical Cancer Screening (Age 27-65) Last Pap:  December 2020 Results were: no abnormalities with HPV negative  Family History of Breast Cancer: no Family History of Ovarian Cancer: no    Weight Wt Readings from Last 3 Encounters:  01/04/22 235 lb 8 oz (106.8 kg)  10/14/21 236 lb (107 kg)  09/09/20 225 lb 8 oz (102.3 kg)   Patient has high BMI  BMI Readings from Last 1 Encounters:  01/04/22 43.78 kg/m     Chronic disease screening Blood pressure monitoring:  BP Readings from Last 3 Encounters:  01/04/22  110/68  10/14/21 110/76  08/05/20 118/78     Lipid Monitoring: Indication for screening: age >36, obesity, diabetes, family hx, CV risk factors.  Lipid screening: Yes  Lab Results  Component Value Date   CHOL 162 06/23/2019   HDL 40.40 06/23/2019   LDLCALC 97 06/23/2019   LDLDIRECT 68.4 07/28/2010   TRIG 122.0 06/23/2019   CHOLHDL 4 06/23/2019     Diabetes Screening: age >5, overweight, family hx, PCOS, hx of gestational diabetes, at risk ethnicity, elevated blood pressure >135/80.  Diabetes Screening screening: Yes  Lab Results  Component Value Date   HGBA1C 4.9 10/04/2016      Past Medical History:  Diagnosis Date   Anemia    iron deficiency   Breast lump 2014   Family history of adverse reaction to anesthesia    MOM-HARD TO WAKE UP   Headache    MIGRAINES   Hypothyroidism    Thyroid disease    Vitamin D deficiency     Past Surgical History:  Procedure Laterality Date   CESAREAN SECTION  2014   CHOLECYSTECTOMY  2012   FETAL BLOOD TRANSFUSION  2010   FOOT SURGERY  2013   HYSTEROSCOPY WITH D & C N/A 09/24/2017   Procedure: DILATATION AND CURETTAGE /HYSTEROSCOPY;  Surgeon: Herold Harms, MD;  Location: [facility];  Service: Gynecology;  Laterality: N/A;   LAPAROSCOPIC TUBAL  LIGATION Bilateral 09/24/2017   Procedure: LAPAROSCOPIC BILATERAL TUBAL LIGATION;  Surgeon: Herold Harms, MD;  Location: [facility];  Service: Gynecology;  Laterality: Bilateral;   LAPAROSCOPY N/A 09/24/2017   Procedure: LAPAROSCOPY DIAGNOSTIC;  Surgeon: Herold Harms, MD;  Location: [facility];  Service: Gynecology;  Laterality: N/A;   TUBAL LIGATION      Prior to Admission medications   Medication Sig Start Date End Date Taking? Authorizing Provider  albuterol (VENTOLIN HFA) 108 (90 Base) MCG/ACT inhaler Inhale 2 puffs into the lungs every 6 (six) hours as needed for wheezing or shortness of breath. 11/30/17  Yes Emi Belfast, FNP  buPROPion (WELLBUTRIN XL) 150  MG 24 hr tablet Take 3 tablets by mouth once daily 12/05/21  Yes Dugal, Tabitha, FNP  Cholecalciferol (VITAMIN D) 50 MCG (2000 UT) tablet Take 2,000 Units by mouth daily.   Yes [provider]  ibuprofen (ADVIL,MOTRIN) 800 MG tablet Take 1 tablet (800 mg total) by mouth 3 (three) times daily. 09/24/17  Yes Defrancesco, Prentice Docker, MD  levothyroxine (SYNTHROID) 100 MCG tablet Take 1 tablet (100 mcg total) by mouth daily. 10/17/21 01/05/23 Yes Mort Sawyers, FNP    No Known Allergies  Gynecologic History: Patient's last menstrual period was 12/22/2021 (exact date).  Obstetric History: G1P1001  Social History   Socioeconomic History   Marital status: Married    Spouse name: Josh   Number of children: 1   Years of education: Some College   Highest education level: Not on file  Occupational History   Occupation: Administrative advantage  Tobacco Use   Smoking status: Never   Smokeless tobacco: Never  Vaping Use   Vaping Use: Never used  Substance and Sexual Activity   Alcohol use: Yes    Comment: 2 times once a week or every other week   Drug use: No   Sexual activity: Yes    Birth control/protection: I.U.D., Surgical  Other Topics Concern   Not on file  Social History Narrative   01/15/20   From: [place] originally - moved to live with Aunt when mom got sick   Living: with Retail buyer and son Mal Amabile   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, forensic      Family: has in-laws nearby with OK relationships      Enjoys: camping, hiking, fishing, swimming      Exercise: beach body on demand - 30 minutes a day   Diet: working on low calorie       Safety   Seat belts: Yes    Guns: Yes  and secure   Safe in relationships: Yes    Social Determinants of Corporate investment banker Strain: Not on file  Food Insecurity: Not on file  Transportation Needs: Not on file  Physical Activity: Not on file  Stress: Not on file  Social Connections: Not on file  Intimate Partner Violence: Not on  file    Family History  Problem Relation Age of Onset   Hypertension Mother    Diabetes Mother    Alcohol abuse Mother    Drug abuse Mother    Cirrhosis Mother    Anxiety disorder Father    Bipolar disorder Sister    Anxiety disorder Sister    Depression Sister    Charcot-Marie-Tooth disease Sister    GER disease Son    Stroke Maternal Grandmother 60   Diabetes Maternal Grandfather    Heart attack Maternal Grandfather 18   Hypertension Paternal Grandmother  COPD Paternal Grandmother    Lung cancer Paternal Grandfather    Anxiety disorder Sister    Drug abuse Sister    Breast cancer Neg Hx    Ovarian cancer Neg Hx    Colon cancer Neg Hx     Review of Systems  Constitutional:  Negative for chills and fever.  HENT:  Negative for congestion and sore throat.   Eyes:  Negative for blurred vision and double vision.  Respiratory:  Negative for shortness of breath.   Cardiovascular:  Negative for chest pain.  Gastrointestinal:  Negative for heartburn, nausea and vomiting.  Genitourinary: Negative.   Musculoskeletal: Negative.  Negative for myalgias.  Skin:  Negative for rash.  Neurological:  Negative for dizziness and headaches.  Endo/Heme/Allergies:  Does not bruise/bleed easily.       Cold intolerance  Psychiatric/Behavioral:  Negative for depression. The patient is not nervous/anxious.     Physical Exam BP 110/68    Pulse 88    Temp 97.9 F (36.6 C) (Oral)    Ht 5' 1.5" (1.562 m)    Wt 235 lb 8 oz (106.8 kg)    LMP 12/22/2021 (Exact Date)    SpO2 99%    BMI 43.78 kg/m    BP Readings from Last 3 Encounters:  01/04/22 110/68  10/14/21 110/76  08/05/20 118/78    Wt Readings from Last 3 Encounters:  01/04/22 235 lb 8 oz (106.8 kg)  10/14/21 236 lb (107 kg)  09/09/20 225 lb 8 oz (102.3 kg)     Physical Exam Constitutional:      General: She is not in acute distress.    Appearance: She is well-developed. She is not diaphoretic.  HENT:     Head: Normocephalic  and atraumatic.     Right Ear: External ear normal.     Left Ear: External ear normal.     Nose: Nose normal.  Eyes:     General: No scleral icterus.    Extraocular Movements: Extraocular movements intact.     Conjunctiva/sclera: Conjunctivae normal.  Cardiovascular:     Rate and Rhythm: Normal rate and regular rhythm.     Heart sounds: No murmur heard. Pulmonary:     Effort: Pulmonary effort is normal. No respiratory distress.     Breath sounds: Normal breath sounds. No wheezing.  Abdominal:     General: Bowel sounds are normal. There is no distension.     Palpations: Abdomen is soft. There is no mass.     Tenderness: There is no abdominal tenderness. There is no guarding or rebound.  Musculoskeletal:        General: Normal range of motion.     Cervical back: Neck supple.  Lymphadenopathy:     Cervical: No cervical adenopathy.  Skin:    General: Skin is warm and dry.     Capillary Refill: Capillary refill takes less than 2 seconds.  Neurological:     Mental Status: She is alert and oriented to person, place, and time.     Deep Tendon Reflexes: Reflexes normal.  Psychiatric:        Mood and Affect: Mood normal.        Behavior: Behavior normal.      Results: [facility] 10/14/2021 10/14/2021 09/09/2020  Decreased Interest 2 0 0  Down, Depressed, Hopeless 1 1 0  PHQ - 2 Score 3 1 0  Altered sleeping 1 - -  Tired, decreased energy 0 - -  Change in appetite  3 - -  Feeling bad or failure about yourself  0 - -  Trouble concentrating 3 - -  Moving slowly or fidgety/restless 0 - -  Suicidal thoughts 0 - -  PHQ-9 Score 10 - -  Difficult doing work/chores - - -   End of the last year was hard, more work stress. MIL died. Has been speaking with a pastor friend.    Assessment: 69 y.o. G36P1001 female here for routine annual examination.  Plan: Problem List Items Addressed This Visit       Endocrine   Other specified hypothyroidism   Relevant Orders    TSH     Other   Morbid obesity (HCC)   Relevant Orders   Comprehensive metabolic panel   Lipid panel   Insomnia    History of sleepwalking.  Discussed sleep hygiene and encouraged using earplugs as suspect she might be more sensitive to his light sounds that are waking her up.  Follow-up as needed      Other Visit Diagnoses     Annual physical exam    -  Primary   Relevant Orders   Comprehensive metabolic panel   Lipid panel   TSH        Screening: -- Blood pressure screen normal -- cholesterol screening: will obtain -- Weight screening: obese: discussed management options, including lifestyle, dietary, and exercise. -- Diabetes Screening: will obtain -- Nutrition: encouraged healthy diet   Psych -- Depression screening (PHQ-9):  Flowsheet Row Office Visit from 10/14/2021 in [facility]  PHQ-9 Total Score 10        Safety -- tobacco screening: not using -- alcohol screening:  low-risk usage. -- no evidence of domestic violence or intimate partner violence.  Cancer Screening -- pap smear not collected per ASCCP guidelines -- family history of breast cancer screening: done. not at high risk.   Immunizations Immunization History  Administered Date(s) Administered   Influenza, Seasonal, Injecte, Preservative Fre 07/01/2015, 08/29/2016   Influenza,inj,Quad PF,6+ Mos 07/08/2013, 07/15/2014, 06/23/2019, 08/05/2020, 09/28/2021   Influenza-Unspecified 07/08/2017, 09/16/2017, 07/08/2018   Td 04/25/2010    -- flu vaccine up to date -- TDAP q10 years up to date - got in last pregnancy due in 2 years -- Covid-19 Vaccine up to date  Encouraged regular vision and dental screening. Encouraged healthy exercise and diet.   Lynnda Child

## 2022-01-04 NOTE — Patient Instructions (Signed)
Consider wearing ear plugs ?Avoid walking around the house ? ?Sleep hygiene checklist: ?1. Avoid naps during the day ?2. Avoid stimulants such as caffeine and nicotine. Avoid bedtime alcohol (it can speed onset of sleep but the body's metabolism can cause awakenings). At least 2 hours before bedtime ?3. All forms of exercise help ensure sound sleep - limit vigorous exercise to morning or late afternoon ?4. Avoid food too close to bedtime including chocolate (which contains caffeine) ?5. Soak up natural light ?6. Establish regular bedtime routine. ?7. Associate bed with sleep - avoid TV, computer or phone, reading while in bed. ?8. Ensure pleasant, relaxing sleep environment - quiet, dark, cool room. ? ?Good Sleep Hygiene Habits ?-- Got to bed and wake up within an hour of the same time every day ?-- Avoid bright screens (from laptop, phone, TV) within at least 30 minutes before bed. The "blue light" supresses the sleep hormone melatonin and the content may stimulate as well ?-- Maintain a quiet and dark sleep environment (blackout curtains, turn on a fan or white noise to block out disruptive sounds) ?-- Practicing relaxing activites before bed (taking a shower, reading a book, journaling, meditation app) ?-- To quiet a busy mind -- consider journaling before bed (jotting down reminders, worry thoughts, as well as positive things like a gratitude list) ? ? ?Begin a Mindfulness/Meditation practice -- this can take a little as 3 minutes ?-- You can find resources in books ?-- Or you can download apps like  ?---- Headspace App (which currently has free content called "Weathering the Storm") ?---- Calm (which has a few free options)  ?---- Insignt Timer ?---- Stop, Breathe & Think ? ?# With each of these Apps - you should decline the "start free trial" offer and as you search through the App should be able to access some of their free content. You can also chose to pay for the content if you find one that works well for  you.  ? ?# Many of them also offer sleep specific content which may help with insomnia ? ?

## 2022-01-04 NOTE — Assessment & Plan Note (Signed)
History of sleepwalking.  Discussed sleep hygiene and encouraged using earplugs as suspect she might be more sensitive to his light sounds that are waking her up.  Follow-up as needed ?

## 2022-01-09 ENCOUNTER — Other Ambulatory Visit: Payer: Self-pay

## 2022-01-09 DIAGNOSIS — E038 Other specified hypothyroidism: Secondary | ICD-10-CM

## 2022-01-09 MED ORDER — LEVOTHYROXINE SODIUM 100 MCG PO TABS: 100.0000 ug | ORAL_TABLET | Freq: Every day | ORAL | 1 refills | Status: DC

## 2022-01-19 ENCOUNTER — Telehealth: Payer: Self-pay

## 2022-01-19 NOTE — Telephone Encounter (Signed)
Called and lvm for patient to call us back regarding refill requested that [facility] her L-Thyroxine, pt just had a refill. Need to confirm she needs a  [facility]ill [facility].    ?

## 2022-01-24 ENCOUNTER — Other Ambulatory Visit: Payer: Self-pay | Admitting: Family

## 2022-01-24 DIAGNOSIS — F4323 Adjustment disorder with mixed anxiety and depressed mood: Secondary | ICD-10-CM

## 2022-05-31 ENCOUNTER — Ambulatory Visit: Admission: EM | Admit: 2022-05-31 | Discharge status: Absent without leave | Payer: Managed Care, Other (non HMO)

## 2022-06-26 ENCOUNTER — Ambulatory Visit (INDEPENDENT_AMBULATORY_CARE_PROVIDER_SITE_OTHER): Payer: Managed Care, Other (non HMO) | Admitting: Family

## 2022-06-26 ENCOUNTER — Encounter: Payer: Self-pay | Admitting: Family

## 2022-06-26 VITALS — BP 118/70 | HR 90 | Temp 98.6°F | Resp 16 | Ht 61.5 in | Wt 233.0 lb

## 2022-06-26 DIAGNOSIS — L409 Psoriasis, unspecified: Secondary | ICD-10-CM

## 2022-06-26 DIAGNOSIS — J45909 Unspecified asthma, uncomplicated: Secondary | ICD-10-CM | POA: Insufficient documentation

## 2022-06-26 DIAGNOSIS — J4520 Mild intermittent asthma, uncomplicated: Secondary | ICD-10-CM

## 2022-06-26 DIAGNOSIS — R21 Rash and other nonspecific skin eruption: Secondary | ICD-10-CM

## 2022-06-26 DIAGNOSIS — F4323 Adjustment disorder with mixed anxiety and depressed mood: Secondary | ICD-10-CM

## 2022-06-26 DIAGNOSIS — E041 Nontoxic single thyroid nodule: Secondary | ICD-10-CM | POA: Diagnosis not present

## 2022-06-26 DIAGNOSIS — E039 Hypothyroidism, unspecified: Secondary | ICD-10-CM | POA: Insufficient documentation

## 2022-06-26 DIAGNOSIS — E559 Vitamin D deficiency, unspecified: Secondary | ICD-10-CM

## 2022-06-26 DIAGNOSIS — Z7689 Persons encountering health services in other specified circumstances: Secondary | ICD-10-CM

## 2022-06-26 MED ORDER — CLOBETASOL PROPIONATE 0.05 % EX SOLN: 1.0000 | Freq: Two times a day (BID) | CUTANEOUS | 0 refills | Status: AC

## 2022-06-26 MED ORDER — CLOBETASOL PROPIONATE 0.05 % EX CREA: 1.0000 | TOPICAL_CREAM | Freq: Two times a day (BID) | CUTANEOUS | 0 refills | Status: AC

## 2022-06-26 NOTE — Assessment & Plan Note (Signed)
clobetasol solution RX sent in  Start twice weekly x two weeks then two days a week thereafter for maintenance Head and shoulders shampoo may also be beneficial

## 2022-06-26 NOTE — Progress Notes (Signed)
Established Patient Office Visit  Subjective:  Patient ID: Kimberly Garner, female    DOB: 10/06/1986  Age: 36 y.o. MRN: [id number]  CC:  Chief Complaint  Patient presents with   Transitions Of Care    HPI Kimberly Garner is here for a transition of care visit.  Prior provider was: Gweneth Dimitri, MD. Pt is without acute concerns.  Pap 10/23/2019 negative , every five years   chronic concerns:  Hypothyroid: on levothyroxine 100 mcg once daily. Weight often fluctuates, no recent increased weight gain. Does have some dry skin on elbows and scalp is very dry. Knees also very dry.  Lab Results  Component Value Date   TSH 1.73 01/04/2022   Vitamin def: currently taking 2000 Iu once daily.   GAD: doing well at 450 mg wellbutrin. Insurance covers three tablets daily for total of 450 mg   Reactive airway disease: albuterol only once every few months. Last time used was in the winter.  Mostly in the winter time that with more sob.   Past Medical History:  Diagnosis Date   Anemia    iron deficiency   Breast lump 2014   Endometriosis    Endometriosis determined by laparoscopy 09/24/2017   Family history of adverse reaction to anesthesia    MOM-HARD TO WAKE UP   Headache    MIGRAINES   History of blood transfusion 08/14/2017   Hypothyroidism    Status post hysteroscopy/D&C 08/14/2017   Status post laparoscopic bilateral salpingectomy/excision and fulguration of endometriosis 09/24/2017   Thyroid disease    Vitamin D deficiency     Past Surgical History:  Procedure Laterality Date   CESAREAN SECTION  2014   CHOLECYSTECTOMY  2012   FETAL BLOOD TRANSFUSION  2010   not fetal, h/o blood transfusion due to blood loss from endometriosis   FOOT SURGERY  2013   HYSTEROSCOPY WITH D & C N/A 09/24/2017   Procedure: DILATATION AND CURETTAGE /HYSTEROSCOPY;  Surgeon: Herold Harms, MD;  Location: [facility];  Service: Gynecology;  Laterality: N/A;   LAPAROSCOPIC TUBAL LIGATION  Bilateral 09/24/2017   Procedure: LAPAROSCOPIC BILATERAL TUBAL LIGATION;  Surgeon: Herold Harms, MD;  Location: [facility];  Service: Gynecology;  Laterality: Bilateral;   LAPAROSCOPY N/A 09/24/2017   Procedure: LAPAROSCOPY DIAGNOSTIC;  Surgeon: Herold Harms, MD;  Location: [facility];  Service: Gynecology;  Laterality: N/A;    Family History  Problem Relation Age of Onset   Hypertension Mother    Diabetes Mother    Alcohol abuse Mother    Drug abuse Mother    Cirrhosis Mother    Anxiety disorder Father    Depression Father    High blood pressure Father    Bipolar disorder Sister    Anxiety disorder Sister    Depression Sister    Charcot-Marie-Tooth disease Sister    Anxiety disorder Sister    Drug abuse Sister    Stroke Maternal Grandmother 60   Diabetes Maternal Grandfather        feet ambutated due to disease   Heart attack Maternal Grandfather 83   Hypertension Paternal Grandmother    COPD Paternal Grandmother    Dementia Paternal Grandmother    Lung cancer Paternal Grandfather        smoker   GER disease Son    Breast cancer Neg Hx    Ovarian cancer Neg Hx    Colon cancer Neg Hx     Social History   Socioeconomic History   Marital  status: Married    Spouse name: Josh   Number of children: 1   Years of education: Some College   Highest education level: Not on file  Occupational History   Occupation: Designer, industrial/product advantage    Employer: [employer] examiner  Tobacco Use   Smoking status: Never   Smokeless tobacco: Never  Vaping Use   Vaping Use: Never used  Substance and Sexual Activity   Alcohol use: Yes    Comment: once a week   Drug use: No   Sexual activity: Yes    Partners: Male    Birth control/protection: I.U.D., Surgical  Other Topics Concern   Not on file  Social History Narrative   01/15/20   From: [place] originally - moved to live with Aunt when mom got sick   Living: with Sharia Reeve and son Marella Chimes   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, forensic      Family: has in-laws nearby with OK relationships      Enjoys: camping, hiking, fishing, swimming      Exercise: beach body on demand - 30 minutes a day   Diet: working on low calorie       Safety   Seat belts: Yes    Guns: Yes  and secure   Safe in relationships: Yes    Social Determinants of Corporate investment banker Strain: Not on file  Food Insecurity: Not on file  Transportation Needs: Not on file  Physical Activity: Not on file  Stress: Not on file  Social Connections: Not on file  Intimate Partner Violence: Not on file    Outpatient Medications Prior to Visit  Medication Sig Dispense Refill   albuterol (VENTOLIN HFA) 108 (90 Base) MCG/ACT inhaler Inhale 2 puffs into the lungs every 6 (six) hours as needed for wheezing or shortness of breath. 8 g 1   buPROPion (WELLBUTRIN XL) 150 MG 24 hr tablet Take 3 tablets (450 mg total) by mouth daily. 270 tablet 1   Cholecalciferol (VITAMIN D) 50 MCG (2000 UT) tablet Take 2,000 Units by mouth daily.     levothyroxine (SYNTHROID) 100 MCG tablet Take 1 tablet (100 mcg total) by mouth daily. 90 tablet 1   ibuprofen (ADVIL,MOTRIN) 800 MG tablet Take 1 tablet (800 mg total) by mouth 3 (three) times daily. 30 tablet 1   No facility-administered medications prior to visit.    No Known Allergies      Objective:    Physical Exam Vitals reviewed.  Constitutional:      General: She is not in acute distress.    Appearance: Normal appearance. She is not ill-appearing or toxic-appearing.  HENT:     Right Ear: Tympanic membrane normal.     Left Ear: Tympanic membrane normal.     Mouth/Throat:     Mouth: Mucous membranes are moist.     Pharynx: No pharyngeal swelling.     Tonsils: No tonsillar exudate.  Eyes:     Extraocular Movements: Extraocular movements intact.     Conjunctiva/sclera: Conjunctivae normal.     Pupils: Pupils are equal, round, and reactive to light.  Neck:     Thyroid: No  thyroid mass.  Cardiovascular:     Rate and Rhythm: Normal rate and regular rhythm.  Pulmonary:     Effort: Pulmonary effort is normal.     Breath sounds: Normal breath sounds.  Abdominal:     General: Abdomen is flat. Bowel sounds are normal.     Palpations: Abdomen  is soft.  Musculoskeletal:        General: Normal range of motion.  Lymphadenopathy:     Cervical:     Right cervical: No superficial cervical adenopathy.    Left cervical: No superficial cervical adenopathy.  Skin:    General: Skin is warm.     Capillary Refill: Capillary refill takes less than 2 seconds.     Comments: Scalp with small area of erythema with silver scaling   Neurological:     General: No focal deficit present.     Mental Status: She is alert and oriented to person, place, and time.  Psychiatric:        Mood and Affect: Mood normal.        Behavior: Behavior normal.        Thought Content: Thought content normal.        Judgment: Judgment normal.      BP 118/70   Pulse 90   Temp 98.6 F (37 C)   Resp 16   Ht 5' 1.5" (1.562 m)   Wt 233 lb (105.7 kg)   LMP 06/25/2022   SpO2 98%   BMI 43.31 kg/m  Wt Readings from Last 3 Encounters:  06/26/22 233 lb (105.7 kg)  01/04/22 235 lb 8 oz (106.8 kg)  10/14/21 236 lb (107 kg)     Health Maintenance Due  Topic Date Due   COVID-19 Vaccine (1) Never done   TETANUS/TDAP  04/25/2020   INFLUENZA VACCINE  05/30/2022    There are no preventive care reminders to display for this patient.  Lab Results  Component Value Date   TSH 1.73 01/04/2022   Lab Results  Component Value Date   WBC 7.5 10/14/2021   HGB 13.8 10/14/2021   HCT 40.3 10/14/2021   MCV 91.6 10/14/2021   PLT 256.0 10/14/2021   Lab Results  Component Value Date   NA 135 01/04/2022   K 4.4 01/04/2022   CO2 26 01/04/2022   GLUCOSE 84 01/04/2022   BUN 13 01/04/2022   CREATININE 0.85 01/04/2022   BILITOT 0.5 01/04/2022   ALKPHOS 79 01/04/2022   AST 20 01/04/2022   ALT 18  01/04/2022   PROT 7.1 01/04/2022   ALBUMIN 4.5 01/04/2022   CALCIUM 9.6 01/04/2022   GFR 88.50 01/04/2022   Lab Results  Component Value Date   CHOL 162 01/04/2022   Lab Results  Component Value Date   HDL 43.10 01/04/2022   Lab Results  Component Value Date   LDLCALC 97 06/23/2019   Lab Results  Component Value Date   TRIG 214.0 (H) 01/04/2022   Lab Results  Component Value Date   CHOLHDL 4 01/04/2022   Lab Results  Component Value Date   HGBA1C 4.9 10/04/2016      Assessment & Plan:   Problem List Items Addressed This Visit       Respiratory   Reactive airway disease    Controlled Albuterol prn        Endocrine   Acquired hypothyroidism - Primary    Reviewed most recent tsh, acceptable limits.  continue levothyroxine 100 mcg      Relevant Orders   US THYROID   Left thyroid nodule    US thyroid ordered pending results      Relevant Orders   US THYROID     Musculoskeletal and Integument   Skin eruption    Suspected psoriasis on bil elbows and knees trial clobetasol cream  Relevant Medications   clobetasol cream (TEMOVATE) 0.05 %   Scalp psoriasis    clobetasol solution RX sent in  Start twice weekly x two weeks then two days a week thereafter for maintenance Head and shoulders shampoo may also be beneficial       Relevant Medications   clobetasol (TEMOVATE) 0.05 % external solution     Other   Vitamin D deficiency    Continue daily vitamin D 1000 Iu       Adjustment disorder with mixed anxiety and depressed mood    Continue wellbutrin 450 mg once daily       Encounter to establish care with new doctor    D/w with pt and went over chronic and acute history as indicated Went over family social and medical/surgical history  Discussed h/o lab results and or diagnostic testing as indicated         Meds ordered this encounter  Medications   clobetasol (TEMOVATE) 0.05 % external solution    Sig: Apply 1 Application  topically 2 (two) times daily.    Dispense:  50 mL    Refill:  0    Order Specific Question:   Supervising Provider    Answer:   BEDSOLE, AMY E [[id number]]   clobetasol cream (TEMOVATE) 0.05 %    Sig: Apply 1 Application topically 2 (two) times daily.    Dispense:  30 g    Refill:  0    Order Specific Question:   Supervising Provider    Answer:   Ermalene Searing, AMY E [[id number]]    Follow-up: Return in about 6 months (around 12/27/2022) for regular follow up appt.    Mort Sawyers, FNP

## 2022-06-26 NOTE — Assessment & Plan Note (Signed)
Reviewed most recent tsh, acceptable limits.  continue levothyroxine 100 mcg

## 2022-06-26 NOTE — Assessment & Plan Note (Signed)
Continue wellbutrin 450 mg once daily

## 2022-06-26 NOTE — Assessment & Plan Note (Signed)
Continue daily vitamin D 1000 Iu

## 2022-06-26 NOTE — Assessment & Plan Note (Signed)
US thyroid ordered pending results

## 2022-06-26 NOTE — Assessment & Plan Note (Signed)
Suspected psoriasis on bil elbows and knees trial clobetasol cream

## 2022-06-26 NOTE — Assessment & Plan Note (Signed)
D/w with pt and went over chronic and acute history as indicated Went over family social and medical/surgical history  Discussed h/o lab results and or diagnostic testing as indicated   

## 2022-06-26 NOTE — Patient Instructions (Addendum)
  As far as your scalp, start solution twice daily for two weeks.  Once complete with two weeks go to twice a week such as Monday and Friday and do this weekly for maintenance.   Your imaging for ultrasound thyroid Has been ordered electronically. Please call the location below to schedule appt:   [place]:   [address] [facility] Phone [phone],  8-430 pm   Welcome to our clinic, I am happy to have you as my new patient. I am excited to continue on this healthcare journey with you.  Stop by the lab prior to leaving today. I will notify you of your results once received.   Please keep in mind Any my chart messages you send have up to a three business day turnaround for a response.  Phone calls may take up to a one full business day turnaround for a  response.   If you need a medication refill I recommend you request it through the pharmacy as this is easiest for [place] rather than sending a message and or phone call.   Due to recent changes in healthcare laws, you may see results of your imaging and/or laboratory studies on MyChart before I have had a chance to review them.  I understand that in some cases there may be results that are confusing or concerning to you. Please understand that not all results are received at the same time and often I may need to interpret multiple results in order to provide you with the best plan of care or course of treatment. Therefore, I ask that you please give me 2 business days to thoroughly review all your results before contacting my office for clarification. Should we see a critical lab result, you will be contacted sooner.   It was a pleasure seeing you today! Please do not hesitate to reach out with any questions and or concerns.  Regards,   Mort Sawyers FNP-C

## 2022-06-26 NOTE — Assessment & Plan Note (Signed)
Controlled Albuterol prn

## 2022-07-05 ENCOUNTER — Other Ambulatory Visit: Payer: Managed Care, Other (non HMO)

## 2022-07-06 ENCOUNTER — Ambulatory Visit
Admission: RE | Admit: 2022-07-06 | Discharge: 2022-07-06 | Disposition: A | Discharge status: Home / Self Care | Payer: Managed Care, Other (non HMO) | Source: Ambulatory Visit | Attending: Family | Admitting: Family

## 2022-07-06 DIAGNOSIS — E041 Nontoxic single thyroid nodule: Secondary | ICD-10-CM

## 2022-07-06 DIAGNOSIS — E039 Hypothyroidism, unspecified: Secondary | ICD-10-CM

## 2022-07-12 ENCOUNTER — Other Ambulatory Visit: Payer: Self-pay | Admitting: Family Medicine

## 2022-07-12 DIAGNOSIS — E038 Other specified hypothyroidism: Secondary | ICD-10-CM

## 2022-08-08 ENCOUNTER — Other Ambulatory Visit: Payer: Self-pay | Admitting: Family

## 2022-08-08 DIAGNOSIS — F4323 Adjustment disorder with mixed anxiety and depressed mood: Secondary | ICD-10-CM

## 2022-10-16 ENCOUNTER — Other Ambulatory Visit: Payer: Self-pay

## 2022-10-16 DIAGNOSIS — E038 Other specified hypothyroidism: Secondary | ICD-10-CM

## 2022-10-16 NOTE — Telephone Encounter (Signed)
Refill request received as pended from pharmacy

## 2022-10-17 MED ORDER — LEVOTHYROXINE SODIUM 100 MCG PO TABS: 100.0000 ug | ORAL_TABLET | Freq: Every day | ORAL | 0 refills | Status: DC

## 2022-10-25 ENCOUNTER — Other Ambulatory Visit: Payer: Self-pay

## 2022-10-25 DIAGNOSIS — E038 Other specified hypothyroidism: Secondary | ICD-10-CM

## 2022-10-25 MED ORDER — LEVOTHYROXINE SODIUM 100 MCG PO TABS: 100.0000 ug | ORAL_TABLET | Freq: Every day | ORAL | 0 refills | Status: DC

## 2022-10-26 ENCOUNTER — Ambulatory Visit
Admission: EM | Admit: 2022-10-26 | Discharge: 2022-10-26 | Disposition: A | Discharge status: Home / Self Care | Payer: Managed Care, Other (non HMO) | Attending: Urgent Care | Admitting: Urgent Care

## 2022-10-26 DIAGNOSIS — J069 Acute upper respiratory infection, unspecified: Secondary | ICD-10-CM

## 2022-10-26 MED ORDER — BENZONATATE 100 MG PO CAPS: ORAL_CAPSULE | ORAL | 0 refills | Status: DC

## 2022-10-26 MED ORDER — HYDROCOD POLI-CHLORPHE POLI ER 10-8 MG/5ML PO SUER: 5.0000 mL | Freq: Two times a day (BID) | ORAL | 0 refills | Status: DC | PRN

## 2022-10-26 MED ORDER — HYDROCOD POLI-CHLORPHE POLI ER 10-8 MG/5ML PO SUER: 5.0000 mL | Freq: Two times a day (BID) | ORAL | 0 refills | Status: AC | PRN

## 2022-10-26 NOTE — Discharge Instructions (Signed)
Follow up here or with your primary care provider if your symptoms are worsening or not improving.     

## 2022-10-26 NOTE — ED Triage Notes (Signed)
Pt. Presents to UC w/ c/o a cough, congestion and sore throat for the past 2 days.

## 2022-10-26 NOTE — ED Provider Notes (Signed)
Renaldo Fiddler    CSN: [id number] Arrival date & time: 10/26/22  1812      History   Chief Complaint Chief Complaint  Patient presents with   Cough    Congestion and sore throat - Entered by patient   Nasal Congestion   Sore Throat    HPI Kimberly Garner is a 36 y.o. female.    Cough Sore Throat    Presents to urgent care with complaint of cough, congestion, sore throat x 2 days.  Denies fever but endorses some chills.  She has a hoarse voice.  Past Medical History:  Diagnosis Date   Anemia    iron deficiency   Breast lump 2014   Endometriosis    Endometriosis determined by laparoscopy 09/24/2017   Family history of adverse reaction to anesthesia    MOM-HARD TO WAKE UP   Headache    MIGRAINES   History of blood transfusion 08/14/2017   Hypothyroidism    Status post hysteroscopy/D&C 08/14/2017   Status post laparoscopic bilateral salpingectomy/excision and fulguration of endometriosis 09/24/2017   Thyroid disease    Vitamin D deficiency     Patient Active Problem List   Diagnosis Date Noted   Acquired hypothyroidism 06/26/2022   Reactive airway disease 06/26/2022   Left thyroid nodule 06/26/2022   Skin eruption 06/26/2022   Scalp psoriasis 06/26/2022   Encounter to establish care with new doctor 06/26/2022   Insomnia 06/23/2019   Adjustment disorder with mixed anxiety and depressed mood 06/23/2019   Vitamin D deficiency 06/20/2018   PCO (polycystic ovaries) 08/14/2017   Morbid obesity (HCC) 08/14/2017   ANEMIA-IRON DEFICIENCY 04/25/2010    Past Surgical History:  Procedure Laterality Date   CESAREAN SECTION  2014   CHOLECYSTECTOMY  2012   FETAL BLOOD TRANSFUSION  2010   not fetal, h/o blood transfusion due to blood loss from endometriosis   FOOT SURGERY  2013   HYSTEROSCOPY WITH D & C N/A 09/24/2017   Procedure: DILATATION AND CURETTAGE /HYSTEROSCOPY;  Surgeon: Herold Harms, MD;  Location: [facility];  Service: Gynecology;   Laterality: N/A;   LAPAROSCOPIC TUBAL LIGATION Bilateral 09/24/2017   Procedure: LAPAROSCOPIC BILATERAL TUBAL LIGATION;  Surgeon: Herold Harms, MD;  Location: [facility];  Service: Gynecology;  Laterality: Bilateral;   LAPAROSCOPY N/A 09/24/2017   Procedure: LAPAROSCOPY DIAGNOSTIC;  Surgeon: Herold Harms, MD;  Location: [facility];  Service: Gynecology;  Laterality: N/A;    OB History     Gravida  1   Para  1   Term  1   Preterm      AB      Living  1      SAB      IAB      Ectopic      Multiple      Live Births  1        Obstetric Comments  1st Menstrual Cycle:  11 1st Pregnancy:  26          Home Medications    Prior to Admission medications   Medication Sig Start Date End Date Taking? Authorizing Provider  albuterol (VENTOLIN HFA) 108 (90 Base) MCG/ACT inhaler Inhale 2 puffs into the lungs every 6 (six) hours as needed for wheezing or shortness of breath. 11/30/17   Emi Belfast, FNP  buPROPion (WELLBUTRIN XL) 150 MG 24 hr tablet Take 3 tablets by mouth once daily 08/09/22   Mort Sawyers, FNP  Cholecalciferol (VITAMIN D) 50 MCG (2000  UT) tablet Take 2,000 Units by mouth daily.    [provider]  clobetasol (TEMOVATE) 0.05 % external solution Apply 1 Application topically 2 (two) times daily. 06/26/22   Mort Sawyers, FNP  clobetasol cream (TEMOVATE) 0.05 % Apply 1 Application topically 2 (two) times daily. 06/26/22   Mort Sawyers, FNP  levothyroxine (SYNTHROID) 100 MCG tablet Take 1 tablet (100 mcg total) by mouth daily. 10/25/22   Margaree Mackintosh, MD    Family History Family History  Problem Relation Age of Onset   Hypertension Mother    Diabetes Mother    Alcohol abuse Mother    Drug abuse Mother    Cirrhosis Mother    Anxiety disorder Father    Depression Father    High blood pressure Father    Bipolar disorder Sister    Anxiety disorder Sister    Depression Sister    Charcot-Marie-Tooth disease Sister     Anxiety disorder Sister    Drug abuse Sister    Stroke Maternal Grandmother 60   Diabetes Maternal Grandfather        feet ambutated due to disease   Heart attack Maternal Grandfather 107   Hypertension Paternal Grandmother    COPD Paternal Grandmother    Dementia Paternal Grandmother    Lung cancer Paternal Grandfather        smoker   GER disease Son    Breast cancer Neg Hx    Ovarian cancer Neg Hx    Colon cancer Neg Hx     Social History Social History   Tobacco Use   Smoking status: Never   Smokeless tobacco: Never  Vaping Use   Vaping Use: Never used  Substance Use Topics   Alcohol use: Yes    Comment: once a week   Drug use: No     Allergies   Patient has no known allergies.   Review of Systems Review of Systems  Respiratory:  Positive for cough.      Physical Exam Triage Vital Signs ED Triage Vitals  Enc Vitals Group     BP 10/26/22 1851 135/84     Pulse Rate 10/26/22 1851 (!) 108     Resp 10/26/22 1851 16     Temp 10/26/22 1851 99 F (37.2 C)     Temp src --      SpO2 10/26/22 1851 96 %     Weight --      Height --      Head Circumference --      Peak Flow --      Pain Score 10/26/22 1852 0     Pain Loc --      Pain Edu? --      Excl. in GC? --    No data found.  Updated Vital Signs BP 135/84   Pulse (!) 108   Temp 99 F (37.2 C)   Resp 16   LMP 10/12/2022 (Approximate)   SpO2 96%   Visual Acuity Right Eye Distance:   Left Eye Distance:   Bilateral Distance:    Right Eye Near:   Left Eye Near:    Bilateral Near:     Physical Exam Vitals reviewed.  Constitutional:      Appearance: She is well-developed.  Cardiovascular:     Rate and Rhythm: Normal rate and regular rhythm.     Heart sounds: Normal heart sounds.  Pulmonary:     Effort: Pulmonary effort is normal.     Breath sounds: Normal  breath sounds.  Skin:    General: Skin is warm and dry.  Neurological:     General: No focal deficit present.     Mental Status:  She is alert and oriented to person, place, and time.  Psychiatric:        Mood and Affect: Mood normal.        Behavior: Behavior normal.      UC Treatments / Results  Labs (all labs ordered are listed, but only abnormal results are displayed) Labs Reviewed - No data to display  EKG   Radiology No results found.  Procedures Procedures (including critical care time)  Medications Ordered in UC Medications - No data to display  Initial Impression / Assessment and Plan / UC Course  I have reviewed the triage vital signs and the nursing notes.  Pertinent labs & imaging results that were available during my care of the patient were reviewed by me and considered in my medical decision making (see chart for details).   Patient is afebrile here without recent antipyretics. Satting well on room air. Overall is ill appearing, though well hydrated, without respiratory distress. Pulmonary exam is unremarkable.  Lungs CTAB without wheezing, rhonchi, rales.  She has severe dry sounding cough which is triggered by deep breathing.  Symptoms are consistent with acute viral process.  She is outside the treatment window for influenza so will not consider antiviral treatment.  Recommending symptomatic control with OTC medications as needed.  Will prescribe benzonatate and Tussionex for nighttime use.  Final Clinical Impressions(s) / UC Diagnoses   Final diagnoses:  None   Discharge Instructions   None    ED Prescriptions   None    PDMP not reviewed this encounter.   Charma Igo, [place] 10/26/22 1901

## 2022-11-17 ENCOUNTER — Other Ambulatory Visit: Payer: Self-pay | Admitting: Family

## 2022-11-17 DIAGNOSIS — F4323 Adjustment disorder with mixed anxiety and depressed mood: Secondary | ICD-10-CM

## 2023-01-01 ENCOUNTER — Other Ambulatory Visit: Payer: Managed Care, Other (non HMO)

## 2023-01-08 ENCOUNTER — Encounter: Payer: Self-pay | Admitting: Family

## 2023-01-08 ENCOUNTER — Ambulatory Visit (INDEPENDENT_AMBULATORY_CARE_PROVIDER_SITE_OTHER): Payer: Managed Care, Other (non HMO) | Admitting: Family

## 2023-01-08 VITALS — BP 118/80 | HR 86 | Temp 98.1°F | Ht 61.5 in | Wt 241.2 lb

## 2023-01-08 DIAGNOSIS — F4323 Adjustment disorder with mixed anxiety and depressed mood: Secondary | ICD-10-CM | POA: Diagnosis not present

## 2023-01-08 DIAGNOSIS — E038 Other specified hypothyroidism: Secondary | ICD-10-CM | POA: Insufficient documentation

## 2023-01-08 DIAGNOSIS — Z23 Encounter for immunization: Secondary | ICD-10-CM | POA: Diagnosis not present

## 2023-01-08 DIAGNOSIS — E781 Pure hyperglyceridemia: Secondary | ICD-10-CM | POA: Diagnosis not present

## 2023-01-08 DIAGNOSIS — Z0001 Encounter for general adult medical examination with abnormal findings: Secondary | ICD-10-CM | POA: Insufficient documentation

## 2023-01-08 DIAGNOSIS — Z6841 Body Mass Index (BMI) 40.0 and over, adult: Secondary | ICD-10-CM

## 2023-01-08 DIAGNOSIS — E039 Hypothyroidism, unspecified: Secondary | ICD-10-CM

## 2023-01-08 DIAGNOSIS — E559 Vitamin D deficiency, unspecified: Secondary | ICD-10-CM

## 2023-01-08 DIAGNOSIS — G4700 Insomnia, unspecified: Secondary | ICD-10-CM

## 2023-01-08 LAB — BASIC METABOLIC PANEL
BUN: 11 mg/dL (ref 6–23)
CO2: 27 mEq/L (ref 19–32)
Calcium: 9.2 mg/dL (ref 8.4–10.5)
Chloride: 101 mEq/L (ref 96–112)
Creatinine, Ser: 0.83 mg/dL (ref 0.40–1.20)
GFR: 90.42 mL/min (ref >60.00)
Glucose, Bld: 86 mg/dL (ref 70–99)
Potassium: 4 mEq/L (ref 3.5–5.1)
Sodium: 136 mEq/L (ref 135–145)

## 2023-01-08 LAB — CBC
HCT: 38.3 % (ref 36.0–46.0)
Hemoglobin: 13.2 g/dL (ref 12.0–15.0)
MCHC: 34.6 g/dL (ref 30.0–36.0)
MCV: 90.6 fl (ref 78.0–100.0)
Platelets: 249 10*3/uL (ref 150.0–400.0)
RBC: 4.23 Mil/uL (ref 3.87–5.11)
RDW: 13 % (ref 11.5–15.5)
WBC: 7 10*3/uL (ref 4.0–10.5)

## 2023-01-08 LAB — LIPID PANEL
Cholesterol: 185 mg/dL (ref 0–200)
HDL: 28.1 mg/dL — ABNORMAL LOW (ref >39.00)
Total CHOL/HDL Ratio: 7
Triglycerides: 691 mg/dL — ABNORMAL HIGH (ref 0.0–149.0)

## 2023-01-08 LAB — HEMOGLOBIN A1C: Hgb A1c MFr Bld: 5.2 % (ref 4.6–6.5)

## 2023-01-08 LAB — TSH: TSH: 3.13 u[IU]/mL (ref 0.35–5.50)

## 2023-01-08 LAB — LDL CHOLESTEROL, DIRECT: Direct LDL: 52 mg/dL

## 2023-01-08 MED ORDER — PHENTERMINE HCL 15 MG PO CAPS: ORAL_CAPSULE | ORAL | 0 refills | Status: DC

## 2023-01-08 MED ORDER — LEVOTHYROXINE SODIUM 100 MCG PO TABS: 100.0000 ug | ORAL_TABLET | Freq: Every day | ORAL | 3 refills | Status: AC

## 2023-01-08 MED ORDER — PHENTERMINE HCL 37.5 MG PO CAPS: ORAL_CAPSULE | ORAL | 0 refills | Status: DC

## 2023-01-08 MED ORDER — BUPROPION HCL ER (XL) 300 MG PO TB24: 300.0000 mg | ORAL_TABLET | Freq: Every day | ORAL | 3 refills | Status: AC

## 2023-01-08 NOTE — Progress Notes (Signed)
Subjective:  Patient ID: Kimberly Garner, female    DOB: 12-19-1985  Age: 37 y.o. MRN: [id number]  Patient Care Team: Eugenia Pancoast, FNP as PCP - General (Family Medicine) Christene Lye, MD (General Surgery)   CC:  Chief Complaint  Patient presents with   Annual Exam    HPI Kimberly Garner is a 37 y.o. female who presents today for an annual physical exam. She reports consuming a general diet. Home exercise routine includes walking 1 hrs per days. She generally feels well. She reports sleeping poorly. She does not have additional problems to discuss today.   Vision:Within last year Dental:Receives regular dental care STD:The patient denies history of sexually transmitted disease.  Lung Cancer Screening with low-dose Chest CT: n/a - Adults age 28-80 who are current cigarette smokers or quit within the last 15 years. Must have 20 pack year history.  AAA Screening: n/a - Men age 19-75 who have ever smoked   Last pap: 10/23/2019, due 10/22/2024   Pt is with acute concerns.  Hard to lose weight and has lack of motivation.       Advanced Directives Patient does have a living will, does not  have a copy in the electronic medical record.   DEPRESSION SCREENING    01/08/2023    7:56 AM 01/04/2022   10:06 AM 10/14/2021    8:22 AM 10/14/2021    8:05 AM 09/09/2020    3:25 PM 08/05/2020    8:34 AM 06/23/2019   10:31 AM  PHQ 2/9 Scores  PHQ - 2 Score 0 0 3 1 0 0 2  PHQ- 9 Score  '7 10   7 9     '$ ROS: Negative unless specifically indicated above in HPI.    Current Outpatient Medications:    albuterol (VENTOLIN HFA) 108 (90 Base) MCG/ACT inhaler, Inhale 2 puffs into the lungs every 6 (six) hours as needed for wheezing or shortness of breath., Disp: 8 g, Rfl: 1   buPROPion (WELLBUTRIN XL) 300 MG 24 hr tablet, Take 1 tablet (300 mg total) by mouth daily., Disp: 90 tablet, Rfl: 3   Cholecalciferol (VITAMIN D) 50 MCG (2000 UT) tablet, Take 2,000 Units by mouth daily., Disp: ,  Rfl:    clobetasol (TEMOVATE) 0.05 % external solution, Apply 1 Application topically 2 (two) times daily., Disp: 50 mL, Rfl: 0   clobetasol cream (TEMOVATE) [id number] %, Apply 1 Application topically 2 (two) times daily., Disp: 30 g, Rfl: 0   phentermine 15 MG capsule, Take one po qd for one week, then start taking new RX of 37.5 mg once daily therafter, Disp: 7 capsule, Rfl: 0   [START ON 01/15/2023] phentermine 37.5 MG capsule, Take one po qd at week two after completing one week of 15 mg once daily., Disp: 21 capsule, Rfl: 0   levothyroxine (SYNTHROID) 100 MCG tablet, Take 1 tablet (100 mcg total) by mouth daily., Disp: 90 tablet, Rfl: 3    Objective:    BP 118/80   Pulse 86   Temp 98.1 F (36.7 C) (Temporal)   Ht 5' 1.5" (1.562 m)   Wt 241 lb 3.2 oz (109.4 kg)   LMP 12/02/2022   SpO2 99%   BMI 44.84 kg/m   BP Readings from Last 3 Encounters:  01/08/23 118/80  10/26/22 135/84  06/26/22 118/70      Physical Exam Constitutional:      General: She is not in acute distress.    Appearance: Normal appearance. She is obese.  She is not ill-appearing.  HENT:     Head: Normocephalic.     Right Ear: Tympanic membrane normal.     Left Ear: Tympanic membrane normal.     Nose: Nose normal.     Mouth/Throat:     Mouth: Mucous membranes are moist.  Eyes:     Extraocular Movements: Extraocular movements intact.     Pupils: Pupils are equal, round, and reactive to light.  Cardiovascular:     Rate and Rhythm: Normal rate and regular rhythm.  Pulmonary:     Effort: Pulmonary effort is normal.     Breath sounds: Normal breath sounds.  Abdominal:     General: Abdomen is flat. Bowel sounds are normal.     Palpations: Abdomen is soft.     Tenderness: There is no guarding or rebound.  Musculoskeletal:        General: Normal range of motion.     Cervical back: Normal range of motion.  Skin:    General: Skin is warm.     Capillary Refill: Capillary refill takes less than 2 seconds.   Neurological:     General: No focal deficit present.     Mental Status: She is alert.  Psychiatric:        Mood and Affect: Mood normal.        Behavior: Behavior normal.        Thought Content: Thought content normal.        Judgment: Judgment normal.          Assessment & Plan:  High triglycerides Assessment & Plan: Work on low cholesterol diet, exercise as tolerated.    Orders: -     Lipid panel  Other specified hypothyroidism -     Levothyroxine Sodium; Take 1 tablet (100 mcg total) by mouth daily.  Dispense: 90 tablet; Refill: 3  Encounter for general adult medical examination with abnormal findings Assessment & Plan: Patient Counseling(The following topics were reviewed):  Preventative care handout given to pt  Health maintenance and immunizations reviewed. Please refer to Health maintenance section. Pt advised on safe sex, wearing seatbelts in car, and proper nutrition labwork ordered today for annual Dental health: Discussed importance of regular tooth brushing, flossing, and dental visits.    Orders: -     TSH -     Lipid panel -     Basic metabolic panel -     CBC  Morbid obesity ([place]) Assessment & Plan: Pt advised to:   Start phentermine 15 mg once daily , take 30 minutes prior to a meal.  Take this for two weeks and then increase to 37.5 mg once daily thereafter if tolerating well.  Follow up within 3 weeks with me in office to document weight, and follow up on how you are tolerating the medication.   Orders: -     Hemoglobin A1c  Adjustment disorder with mixed anxiety and depressed mood Assessment & Plan: Continue wellbutrin 300 mg XL once daily  Pt stable at this time.    Class 3 severe obesity due to excess calories without serious comorbidity with body mass index (BMI) of 40.0 to 44.9 in adult [facility]) Assessment & Plan: Pt advised to:   Start phentermine 15 mg once daily , take 30 minutes prior to a meal.  Take this for two weeks and  then increase to 37.5 mg once daily thereafter if tolerating well.  Follow up within 3 weeks with me in office to document weight, and follow  up on how you are tolerating the medication.   Orders: -     Phentermine HCl; Take one po qd at week two after completing one week of 15 mg once daily.  Dispense: 21 capsule; Refill: 0 -     Phentermine HCl; Take one po qd for one week, then start taking new RX of 37.5 mg once daily therafter  Dispense: 7 capsule; Refill: 0  Vitamin D deficiency  Insomnia, unspecified type Assessment & Plan: Pt advised to:  Please work on the following to help with sleep:  -Sleep only long enough to feel rested then get out of bed -Go to bed and get up at the same time every day. -Do not try to force yourself to sleep. If you can't sleep, get out of bed adn try again later. -Have coffee, tea, and other foods that have caffeine only in the morning. -Avoid alcohol -Keep your bedroom dark, cool, quiet, and free of reminders of work or other things that cause you stress -Exercise several days a week, but not right before bed -Avoid looking at phones or reading devices ("e-books") that give off light before bed. This can make it harder to fall asleep    Acquired hypothyroidism Assessment & Plan: Ordered tsh today pending results.  Reviewed most recent tsh, acceptable limits.  continue levothyroxine 100 mcg   Other orders -     Td vaccine greater than or equal to 7yo preservative free IM -     buPROPion HCl ER (XL); Take 1 tablet (300 mg total) by mouth daily.  Dispense: 90 tablet; Refill: 3      Follow-up: Return in about 3 weeks (around 01/29/2023) for f/u weight loss medication.   Eugenia Pancoast, FNP

## 2023-01-08 NOTE — Assessment & Plan Note (Signed)
Continue wellbutrin 300 mg XL once daily  Pt stable at this time.

## 2023-01-08 NOTE — Assessment & Plan Note (Signed)
Work on low cholesterol diet, exercise as tolerated.   

## 2023-01-08 NOTE — Progress Notes (Signed)
Triglycerides very elevated was pt fasting? If no we need to repeat. If she was fasting we need to treat as this over 500 which is a potential risk factor for pancreatitis. Will await response.

## 2023-01-08 NOTE — Patient Instructions (Addendum)
Welcome to our clinic, I am happy to have you as my new patient. I am excited to continue on this healthcare journey with you.  ------------------------------------   Start phentermine 15 mg once daily , take 30 minutes prior to a meal.  Take this for two weeks and then increase to 37.5 mg once daily thereafter if tolerating well.  Follow up within 3 weeks with me in office to document weight, and follow up on how you are tolerating the medication.   Bring with you your new diet plan and weight weigh ins at least once weekly to document your journey.   Some apps to look at our myfitnesspal and or the [employer] app.  Also look at [email or web address] if you prefer pre made meals that are delivered to your door step   ------------------------------------    Stop by the lab prior to leaving today. I will notify you of your results once received.   Please keep in mind Any my chart messages you send have up to a three business day turnaround for a response.  Phone calls may take up to a one full business day turnaround for a  response.   If you need a medication refill I recommend you request it through the pharmacy as this is easiest for [place] rather than sending a message and or phone call.   Due to recent changes in healthcare laws, you may see results of your imaging and/or laboratory studies on MyChart before I have had a chance to review them.  I understand that in some cases there may be results that are confusing or concerning to you. Please understand that not all results are received at the same time and often I may need to interpret multiple results in order to provide you with the best plan of care or course of treatment. Therefore, I ask that you please give me 2 business days to thoroughly review all your results before contacting my office for clarification. Should we see a critical lab result, you will be contacted sooner.   It was a pleasure seeing you today! Please do not hesitate to  reach out with any questions and or concerns.  Regards,   Madisen Ludvigsen FNP-C p

## 2023-01-08 NOTE — Assessment & Plan Note (Signed)
Pt advised to:  Please work on the following to help with sleep:  -Sleep only long enough to feel rested then get out of bed -Go to bed and get up at the same time every day. -Do not try to force yourself to sleep. If you can't sleep, get out of bed adn try again later. -Have coffee, tea, and other foods that have caffeine only in the morning. -Avoid alcohol -Keep your bedroom dark, cool, quiet, and free of reminders of work or other things that cause you stress -Exercise several days a week, but not right before bed -Avoid looking at phones or reading devices ("e-books") that give off light before bed. This can make it harder to fall asleep  

## 2023-01-08 NOTE — Assessment & Plan Note (Signed)
Patient Counseling(The following topics were reviewed): ? Preventative care handout given to pt  ?Health maintenance and immunizations reviewed. Please refer to Health maintenance section. ?Pt advised on safe sex, wearing seatbelts in car, and proper nutrition ?labwork ordered today for annual ?Dental health: Discussed importance of regular tooth brushing, flossing, and dental visits. ? ? ?

## 2023-01-08 NOTE — Assessment & Plan Note (Signed)
Pt advised to:   Start phentermine 15 mg once daily , take 30 minutes prior to a meal.  Take this for two weeks and then increase to 37.5 mg once daily thereafter if tolerating well.  Follow up within 3 weeks with me in office to document weight, and follow up on how you are tolerating the medication.

## 2023-01-08 NOTE — Assessment & Plan Note (Addendum)
Ordered tsh today pending results.  Reviewed most recent tsh, acceptable limits.  continue levothyroxine 100 mcg

## 2023-01-09 ENCOUNTER — Encounter: Payer: Self-pay | Admitting: Family

## 2023-01-09 DIAGNOSIS — E781 Pure hyperglyceridemia: Secondary | ICD-10-CM

## 2023-01-09 MED ORDER — ICOSAPENT ETHYL 1 G PO CAPS: 2.0000 g | ORAL_CAPSULE | Freq: Two times a day (BID) | ORAL | 1 refills | Status: DC

## 2023-01-09 NOTE — Addendum Note (Signed)
Addended by: Eugenia Pancoast on: 01/09/2023 10:01 AM   Modules accepted: Orders

## 2023-01-11 MED ORDER — GEMFIBROZIL 600 MG PO TABS: 600.0000 mg | ORAL_TABLET | Freq: Two times a day (BID) | ORAL | 1 refills | Status: AC

## 2023-01-30 ENCOUNTER — Other Ambulatory Visit: Payer: Self-pay | Admitting: Family

## 2023-01-30 NOTE — Telephone Encounter (Signed)
Refill rx for phentermine 37.5 MG capsule. Ok to fill med?  LR- 01/08/23 ( 21 caps/ no refill) LV- 01/08/23 NV- 02/09/23

## 2023-01-31 ENCOUNTER — Other Ambulatory Visit: Payer: Self-pay | Admitting: Family

## 2023-02-09 ENCOUNTER — Ambulatory Visit (INDEPENDENT_AMBULATORY_CARE_PROVIDER_SITE_OTHER): Payer: Managed Care, Other (non HMO) | Admitting: Family

## 2023-02-09 ENCOUNTER — Encounter: Payer: Self-pay | Admitting: Family

## 2023-02-09 VITALS — BP 120/80 | HR 84 | Temp 97.9°F | Ht 61.5 in | Wt 227.0 lb

## 2023-02-09 DIAGNOSIS — E781 Pure hyperglyceridemia: Secondary | ICD-10-CM | POA: Diagnosis not present

## 2023-02-09 DIAGNOSIS — E039 Hypothyroidism, unspecified: Secondary | ICD-10-CM | POA: Diagnosis not present

## 2023-02-09 DIAGNOSIS — Z6841 Body Mass Index (BMI) 40.0 and over, adult: Secondary | ICD-10-CM | POA: Diagnosis not present

## 2023-02-09 LAB — TSH: TSH: 0.53 u[IU]/mL (ref 0.35–5.50)

## 2023-02-09 LAB — LIPID PANEL
Cholesterol: 103 mg/dL (ref 0–200)
HDL: 50.1 mg/dL (ref >39.00)
LDL Cholesterol: 44 mg/dL (ref 0–99)
NonHDL: 52.5
Total CHOL/HDL Ratio: 2
Triglycerides: 43 mg/dL (ref 0.0–149.0)
VLDL: 8.6 mg/dL (ref 0.0–40.0)

## 2023-02-09 MED ORDER — PHENTERMINE HCL 37.5 MG PO CAPS
ORAL_CAPSULE | ORAL | 1 refills | Status: AC
Start: 2023-02-09 — End: ?

## 2023-02-09 NOTE — Progress Notes (Signed)
Established Patient Office Visit  Subjective:      CC:  Chief Complaint  Patient presents with   Medical Management of Chronic Issues    Weight loss    HPI: Kimberly Garner is a 37 y.o. female presenting on 02/09/2023 for Medical Management of Chronic Issues (Weight loss) .  Obesity: doing well with phentermine on 37.5 mg once daily.  Denies cp palp and or sob. She has lost 14 pounds  She has started a walking treadmill at her desk and trying not to sit as much at her work.  She also is watching her calorie count and using myfitnesspal   Wt Readings from Last 3 Encounters:  02/09/23 227 lb (103 kg)  01/08/23 241 lb 3.2 oz (109.4 kg)  06/26/22 233 lb (105.7 kg)    Hyperlipidemia: doing well on vascepa tolerating well.  Lab Results  Component Value Date   CHOL 185 01/08/2023   HDL 28.10 (L) 01/08/2023   LDLCALC 97 06/23/2019   LDLDIRECT 52.0 01/08/2023   TRIG (H) 01/08/2023    691.0 Triglyceride is over 400; calculations on Lipids are invalid.   CHOLHDL 7 01/08/2023        Social history:  Relevant past medical, surgical, family and social history reviewed and updated as indicated. Interim medical history since our last visit reviewed.  Allergies and medications reviewed and updated.  DATA REVIEWED: CHART IN EPIC     ROS: Negative unless specifically indicated above in HPI.    Current Outpatient Medications:    albuterol (VENTOLIN HFA) 108 (90 Base) MCG/ACT inhaler, Inhale 2 puffs into the lungs every 6 (six) hours as needed for wheezing or shortness of breath., Disp: 8 g, Rfl: 1   buPROPion (WELLBUTRIN XL) 300 MG 24 hr tablet, Take 1 tablet (300 mg total) by mouth daily., Disp: 90 tablet, Rfl: 3   Cholecalciferol (VITAMIN D) 50 MCG (2000 UT) tablet, Take 2,000 Units by mouth daily., Disp: , Rfl:    clobetasol (TEMOVATE) 0.05 % external solution, Apply 1 Application topically 2 (two) times daily., Disp: 50 mL, Rfl: 0   clobetasol cream (TEMOVATE) 0.05 %,  Apply 1 Application topically 2 (two) times daily., Disp: 30 g, Rfl: 0   gemfibrozil (LOPID) 600 MG tablet, Take 1 tablet (600 mg total) by mouth 2 (two) times daily before a meal., Disp: 180 tablet, Rfl: 1   levothyroxine (SYNTHROID) 100 MCG tablet, Take 1 tablet (100 mcg total) by mouth daily., Disp: 90 tablet, Rfl: 3   phentermine 37.5 MG capsule, Take one po qd, Disp: 30 capsule, Rfl: 1      Objective:    BP 120/80 (BP Location: Right Arm)   Pulse 84   Temp 97.9 F (36.6 C) (Temporal)   Ht 5' 1.5" (1.562 m)   Wt 227 lb (103 kg)   LMP 01/29/2023   SpO2 99%   BMI 42.20 kg/m   Wt Readings from Last 3 Encounters:  02/09/23 227 lb (103 kg)  01/08/23 241 lb 3.2 oz (109.4 kg)  06/26/22 233 lb (105.7 kg)    Physical Exam  Physical Exam Constitutional:      General: not in acute distress.    Appearance: Normal appearance. normal weight. is not ill-appearing, toxic-appearing or diaphoretic.  Cardiovascular:     Rate and Rhythm: Normal rate.  Pulmonary:     Effort: Pulmonary effort is normal.  Musculoskeletal:        General: Normal range of motion.  Neurological:  General: No focal deficit present.     Mental Status: alert and oriented to person, place, and time. Mental status is at baseline.  Psychiatric:        Mood and Affect: Mood normal.        Behavior: Behavior normal.        Thought Content: Thought content normal.        Judgment: Judgment normal.        Assessment & Plan:  Acquired hypothyroidism -     TSH  Class 3 severe obesity due to excess calories without serious comorbidity with body mass index (BMI) of 40.0 to 44.9 in adult Assessment & Plan: Continue phentermine 37.5 mg once daily  Continue diet and exercise changes  Pdmp reviewed.   Orders: -     Phentermine HCl; Take one po qd  Dispense: 30 capsule; Refill: 1  High triglycerides Assessment & Plan: Continue vascepa  Ordered lipid panel, pending results. Work on low cholesterol diet and  exercise as tolerated    Orders: -     Lipid panel     Return in about 6 months (around 08/11/2023) for f/u cholesterol.  Mort Sawyers, MSN, APRN, FNP-C [facility]

## 2023-02-09 NOTE — Assessment & Plan Note (Signed)
Continue vascepa  Ordered lipid panel, pending results. Work on low cholesterol diet and exercise as tolerated

## 2023-02-09 NOTE — Assessment & Plan Note (Signed)
Continue phentermine 37.5 mg once daily  Continue diet and exercise changes  Pdmp reviewed.

## 2023-09-25 ENCOUNTER — Telehealth: Payer: BC Managed Care – PPO | Admitting: Family Medicine

## 2023-09-25 DIAGNOSIS — Z2089 Contact with and (suspected) exposure to other communicable diseases: Secondary | ICD-10-CM

## 2023-09-25 DIAGNOSIS — R0989 Other specified symptoms and signs involving the circulatory and respiratory systems: Secondary | ICD-10-CM

## 2023-09-25 DIAGNOSIS — R051 Acute cough: Secondary | ICD-10-CM

## 2023-09-25 MED ORDER — AZITHROMYCIN 250 MG PO TABS
ORAL_TABLET | ORAL | 0 refills | Status: AC
Start: 2023-09-25 — End: 2023-09-30

## 2023-09-25 MED ORDER — PROMETHAZINE-DM 6.25-15 MG/5ML PO SYRP
5.0000 mL | ORAL_SOLUTION | Freq: Three times a day (TID) | ORAL | 0 refills | Status: AC | PRN
Start: 2023-09-25 — End: ?

## 2023-09-25 MED ORDER — BENZONATATE 100 MG PO CAPS
100.0000 mg | ORAL_CAPSULE | Freq: Two times a day (BID) | ORAL | 0 refills | Status: AC | PRN
Start: 2023-09-25 — End: ?

## 2023-09-25 NOTE — Patient Instructions (Signed)
Kimberly Garner, thank you for joining Freddy Finner, NP for today's virtual visit.  While this provider is not your primary care provider (PCP), if your PCP is located in our provider database this encounter information will be shared with them immediately following your visit.   A [place] MyChart account gives you access to today's visit and all your visits, tests, and labs performed at [facility] " click here if you don't have a [place] MyChart account or go to [email or web address]  Consent: (Patient) Kimberly Garner provided verbal consent for this virtual visit at the beginning of the encounter.  Current Medications:  Current Outpatient Medications:    azithromycin (ZITHROMAX) 250 MG tablet, Take 2 tablets on day 1, then 1 tablet daily on days 2 through 5, Disp: 6 tablet, Rfl: 0   benzonatate (TESSALON) 100 MG capsule, Take 1 capsule (100 mg total) by mouth 2 (two) times daily as needed for cough., Disp: 20 capsule, Rfl: 0   promethazine-dextromethorphan (PROMETHAZINE-DM) 6.25-15 MG/5ML syrup, Take 5 mLs by mouth 3 (three) times daily as needed for cough., Disp: 118 mL, Rfl: 0   albuterol (VENTOLIN HFA) 108 (90 Base) MCG/ACT inhaler, Inhale 2 puffs into the lungs every 6 (six) hours as needed for wheezing or shortness of breath., Disp: 8 g, Rfl: 1   buPROPion (WELLBUTRIN XL) 300 MG 24 hr tablet, Take 1 tablet (300 mg total) by mouth daily., Disp: 90 tablet, Rfl: 3   Cholecalciferol (VITAMIN D) 50 MCG (2000 UT) tablet, Take 2,000 Units by mouth daily., Disp: , Rfl:    clobetasol (TEMOVATE) 0.05 % external solution, Apply 1 Application topically 2 (two) times daily., Disp: 50 mL, Rfl: 0   clobetasol cream (TEMOVATE) 0.05 %, Apply 1 Application topically 2 (two) times daily., Disp: 30 g, Rfl: 0   gemfibrozil (LOPID) 600 MG tablet, Take 1 tablet (600 mg total) by mouth 2 (two) times daily before a meal., Disp: 180 tablet, Rfl: 1   levothyroxine (SYNTHROID) 100 MCG  tablet, Take 1 tablet (100 mcg total) by mouth daily., Disp: 90 tablet, Rfl: 3   phentermine 37.5 MG capsule, Take one po qd, Disp: 30 capsule, Rfl: 1   Medications ordered in this encounter:  Meds ordered this encounter  Medications   azithromycin (ZITHROMAX) 250 MG tablet    Sig: Take 2 tablets on day 1, then 1 tablet daily on days 2 through 5    Dispense:  6 tablet    Refill:  0    Order Specific Question:   Supervising Provider    Answer:   Merrilee Jansky [[id number]]   promethazine-dextromethorphan (PROMETHAZINE-DM) 6.25-15 MG/5ML syrup    Sig: Take 5 mLs by mouth 3 (three) times daily as needed for cough.    Dispense:  118 mL    Refill:  0    Order Specific Question:   Supervising Provider    Answer:   Merrilee Jansky [[id number]]   benzonatate (TESSALON) 100 MG capsule    Sig: Take 1 capsule (100 mg total) by mouth 2 (two) times daily as needed for cough.    Dispense:  20 capsule    Refill:  0    Order Specific Question:   Supervising Provider    Answer:   Merrilee Jansky [[id number]]     *If you need refills on other medications prior to your next appointment, please contact your pharmacy*  Follow-Up: Call back or seek an in-person evaluation if the symptoms worsen or if the  condition fails to improve as anticipated.  [facility] [phone]  Other Instructions  - Take meds as prescribed - Rest voice - Use a cool mist humidifier especially during the winter months when heat dries out the air. - Use saline nose sprays frequently to help soothe nasal passages if they are drying out. - Stay hydrated by drinking plenty of fluids - Keep thermostat turn down low to prevent drying out which can cause a dry cough. -Mucinex - For fever or aches or pains- take tylenol or ibuprofen as directed on bottle             * for fevers greater than 101 orally you may alternate ibuprofen and tylenol every 3 hours.  If you do not improve you will need a follow up visit  in person.    If you have been instructed to have an in-person evaluation today at a local Urgent Care facility, please use the link below. It will take you to a list of all of our available [facility], including address, phone number and hours of operation. Please do not delay care.  [facility]  If you or a family member do not have a primary care provider, use the link below to schedule a visit and establish care. When you choose a [facility] primary care physician or advanced practice provider, you gain a long-term partner in health. Find a Primary Care Provider  Learn more about [facility] in-office and virtual care options:  - Get Care Now

## 2023-09-25 NOTE — Progress Notes (Signed)
Virtual Visit Consent   Kimberly Garner, you are scheduled for a virtual visit with a [facility] provider today. Just as with appointments in the office, your consent must be obtained to participate. Your consent will be active for this visit and any virtual visit you may have with one of our providers in the next 365 days. If you have a MyChart account, a copy of this consent can be sent to you electronically.  As this is a virtual visit, video technology does not allow for your provider to perform a traditional examination. This may limit your provider's ability to fully assess your condition. If your provider identifies any concerns that need to be evaluated in person or the need to arrange testing (such as labs, EKG, etc.), we will make arrangements to do so. Although advances in technology are sophisticated, we cannot ensure that it will always work on either your end or our end. If the connection with a video visit is poor, the visit may have to be switched to a telephone visit. With either a video or telephone visit, we are not always able to ensure that we have a secure connection.  By engaging in this virtual visit, you consent to the provision of healthcare and authorize for your insurance to be billed (if applicable) for the services provided during this visit. Depending on your insurance coverage, you may receive a charge related to this service.  I need to obtain your verbal consent now. Are you willing to proceed with your visit today? Kimberly Garner has provided verbal consent on 09/25/2023 for a virtual visit (video or telephone). Freddy Finner, NP  Date: 09/25/2023 12:17 PM  Virtual Visit via Video Note   I, Freddy Finner, connected with  Kimberly Garner  ([id number], 07/13/1986) on 09/25/23 at 12:15 PM EST by a video-enabled telemedicine application and verified that I am speaking with the correct person using two identifiers.  Location: Patient: Virtual Visit Location Patient:  Home Provider: Virtual Visit Location Provider: Home Office   I discussed the limitations of evaluation and management by telemedicine and the availability of in person appointments. The patient expressed understanding and agreed to proceed.    History of Present Illness: Kimberly Garner is a 37 y.o. who identifies as a female who was assigned female at birth, and is being seen today for cough and congestion  Onset was 2-3 days with congestion and cough  Associated symptoms are nasal congestion and chest but no sputum coming up. 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   Insomnia 06/23/2019   Adjustment disorder with mixed anxiety and depressed mood 06/23/2019   Vitamin D deficiency 06/20/2018   PCO (polycystic ovaries) 08/14/2017   Class 3 severe obesity due to excess calories without serious comorbidity with body mass index (BMI) of 40.0 to 44.9 in adult (HCC) 08/14/2017   ANEMIA-IRON DEFICIENCY 04/25/2010    Allergies: No Known Allergies Medications:  Current Outpatient Medications:    albuterol (VENTOLIN HFA) 108 (90  Base) MCG/ACT inhaler, Inhale 2 puffs into the lungs every 6 (six) hours as needed for wheezing or shortness of breath., Disp: 8 g, Rfl: 1   buPROPion (WELLBUTRIN XL) 300 MG 24 hr tablet, Take 1 tablet (300 mg total) by mouth daily., Disp: 90 tablet, Rfl: 3   Cholecalciferol (VITAMIN D) 50 MCG (2000 UT) tablet, Take 2,000 Units by mouth daily., Disp: , Rfl:    clobetasol (TEMOVATE) 0.05  % external solution, Apply 1 Application topically 2 (two) times daily., Disp: 50 mL, Rfl: 0   clobetasol cream (TEMOVATE) 0.05 %, Apply 1 Application topically 2 (two) times daily., Disp: 30 g, Rfl: 0   gemfibrozil (LOPID) 600 MG tablet, Take 1 tablet (600 mg total) by mouth 2 (two) times daily before a meal., Disp: 180 tablet, Rfl: 1   levothyroxine (SYNTHROID) 100 MCG tablet, Take 1 tablet (100 mcg total) by mouth daily., Disp: 90 tablet, Rfl: 3   phentermine 37.5 MG capsule, Take one po qd, Disp: 30 capsule, Rfl: 1  Observations/Objective: Patient is well-developed, well-nourished in no acute distress.  Resting comfortably  at home.  Head is normocephalic, atraumatic.  No labored breathing.  Speech is clear and coherent with logical content.  Patient is alert and oriented at baseline.  Cough and congestion present   Assessment and Plan:  1. Exposure to pneumonia  - azithromycin (ZITHROMAX) 250 MG tablet; Take 2 tablets on day 1, then 1 tablet daily on days 2 through 5  Dispense: 6 tablet; Refill: 0  2. Chest congestion  - azithromycin (ZITHROMAX) 250 MG tablet; Take 2 tablets on day 1, then 1 tablet daily on days 2 through 5  Dispense: 6 tablet; Refill: 0 - promethazine-dextromethorphan (PROMETHAZINE-DM) 6.25-15 MG/5ML syrup; Take 5 mLs by mouth 3 (three) times daily as needed for cough.  Dispense: 118 mL; Refill: 0 - benzonatate (TESSALON) 100 MG capsule; Take 1 capsule (100 mg total) by mouth 2 (two) times daily as needed for cough.  Dispense: 20 capsule; Refill: 0  3. Acute cough  - benzonatate (TESSALON) 100 MG capsule; Take 1 capsule (100 mg total) by mouth 2 (two) times daily as needed for cough.  Dispense: 20 capsule; Refill: 0  -Given exposure, and quick worsening of symptoms will treat for PNA - Take meds as prescribed - Rest voice - Use a cool mist humidifier especially during the winter months when heat dries out the air. - Use saline nose sprays frequently to help  soothe nasal passages if they are drying out. - Stay hydrated by drinking plenty of fluids - Keep thermostat turn down low to prevent drying out which can cause a dry cough. -Mucinex - For fever or aches or pains- take tylenol or ibuprofen as directed on bottle             * for fevers greater than 101 orally you may alternate ibuprofen and tylenol every 3 hours.  If you do not improve you will need a follow up visit in person.                Reviewed side effects, risks and benefits of medication.    Patient acknowledged agreement and understanding of the plan.   Past Medical, Surgical, Social History, Allergies, and Medications have been Reviewed.    Follow Up Instructions: I discussed the assessment and treatment plan with the patient. The patient was provided an opportunity to ask questions and all were answered. The patient agreed with the plan and demonstrated an  understanding of the instructions.  A copy of instructions were sent to the patient via MyChart unless otherwise noted below.    The patient was advised to call back or seek an in-person evaluation if the symptoms worsen or if the condition fails to improve as anticipated.    Freddy Finner, NP
